# Patient Record
Sex: Female | Born: 1970 | Race: Black or African American | Hispanic: No | Marital: Married | Smoking: Never smoker
Health system: Southern US, Community
[De-identification: ages and names within clinical notes are randomized; demographics above are authoritative.]

## PROBLEM LIST (undated history)

## (undated) DIAGNOSIS — D259 Leiomyoma of uterus, unspecified: Secondary | ICD-10-CM

## (undated) DIAGNOSIS — D649 Anemia, unspecified: Secondary | ICD-10-CM

## (undated) DIAGNOSIS — N946 Dysmenorrhea, unspecified: Secondary | ICD-10-CM

## (undated) HISTORY — PX: DILATION AND CURETTAGE OF UTERUS: SHX78

---

## 1998-03-08 ENCOUNTER — Inpatient Hospital Stay (HOSPITAL_COMMUNITY): Admission: AD | Admit: 1998-03-08 | Discharge: 1998-03-08 | Payer: Self-pay | Admitting: *Deleted

## 1998-04-07 ENCOUNTER — Inpatient Hospital Stay (HOSPITAL_COMMUNITY): Admission: AD | Admit: 1998-04-07 | Discharge: 1998-04-09 | Payer: Self-pay | Admitting: Obstetrics and Gynecology

## 1999-06-22 ENCOUNTER — Other Ambulatory Visit: Admission: RE | Admit: 1999-06-22 | Discharge: 1999-06-22 | Payer: Self-pay | Admitting: *Deleted

## 2000-07-02 ENCOUNTER — Other Ambulatory Visit: Admission: RE | Admit: 2000-07-02 | Discharge: 2000-07-02 | Payer: Self-pay | Admitting: *Deleted

## 2001-07-04 ENCOUNTER — Other Ambulatory Visit: Admission: RE | Admit: 2001-07-04 | Discharge: 2001-07-04 | Payer: Self-pay | Admitting: *Deleted

## 2002-09-03 ENCOUNTER — Other Ambulatory Visit: Admission: RE | Admit: 2002-09-03 | Discharge: 2002-09-03 | Payer: Self-pay | Admitting: Obstetrics & Gynecology

## 2004-03-07 ENCOUNTER — Ambulatory Visit: Payer: Self-pay | Admitting: Family Medicine

## 2004-03-10 ENCOUNTER — Ambulatory Visit: Payer: Self-pay | Admitting: Family Medicine

## 2004-05-15 ENCOUNTER — Inpatient Hospital Stay (HOSPITAL_COMMUNITY): Admission: AD | Admit: 2004-05-15 | Discharge: 2004-05-15 | Payer: Self-pay | Admitting: Obstetrics & Gynecology

## 2004-05-18 ENCOUNTER — Ambulatory Visit (HOSPITAL_COMMUNITY): Admission: AD | Admit: 2004-05-18 | Discharge: 2004-05-19 | Payer: Self-pay | Admitting: Obstetrics & Gynecology

## 2006-01-16 ENCOUNTER — Inpatient Hospital Stay (HOSPITAL_COMMUNITY): Admission: AD | Admit: 2006-01-16 | Discharge: 2006-01-18 | Payer: Self-pay | Admitting: Obstetrics & Gynecology

## 2006-08-13 IMAGING — US US OB COMP LESS 14 WK
1 series · 18 of 28 positions shown · non-contrast
Comparison: none

CLINICAL DATA: History of vaginal bleeding and spotting.
 OB ULTRASOUND <14 WEEKS WITH TRANSVAGINAL MODIFY:
 The uterus is normal in size.  There is an intrauterine gestational sac present.  No yolk sac is seen.  An embryo is visible but no fetal cardiac activity is seen, consistent with fetal demise.  By crown-rump length the estimated gestational age is 7 weeks 1 day.  A small anterior uterine fibroid on the left is noted measuring 8 x 11 x 11 mm.  Both ovaries appear normal with probable resolving corpus luteum cyst on the left.  No free fluid is seen.

[Series 1: us ob comp<14 wk · 18 of 47 slices shown]
[im 1/47]
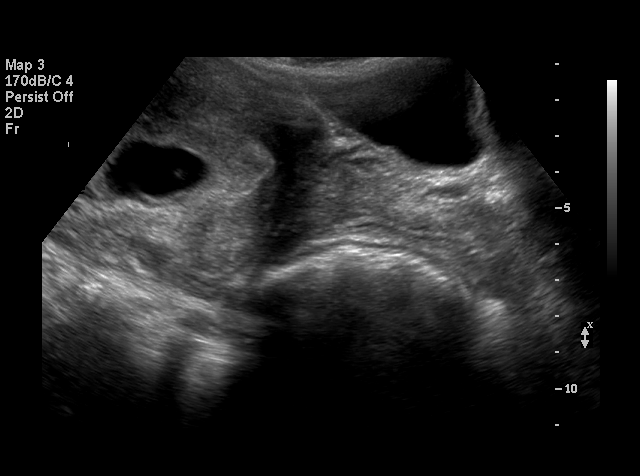
[im 4/47]
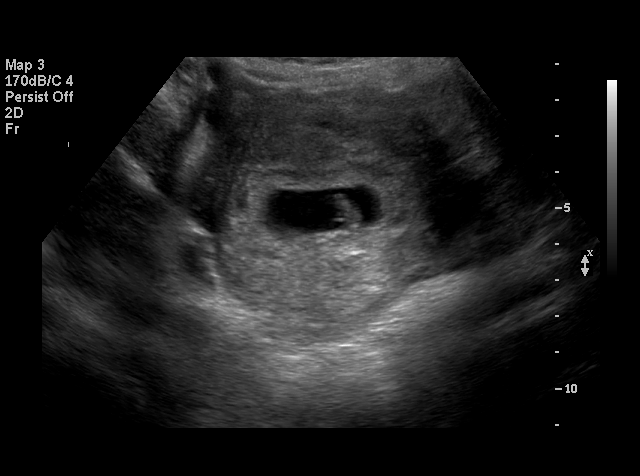
[im 6/47]
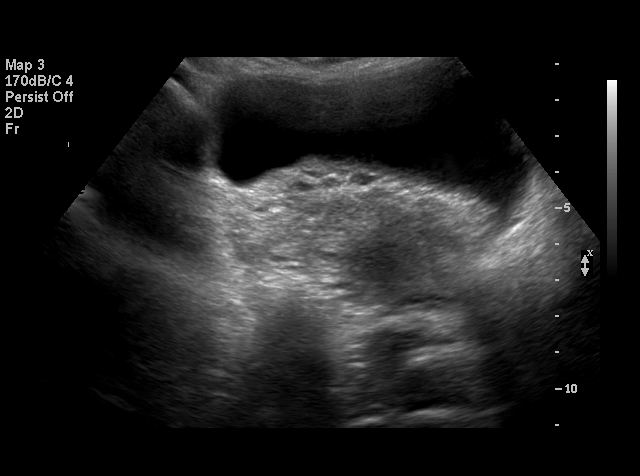
[im 9/47]
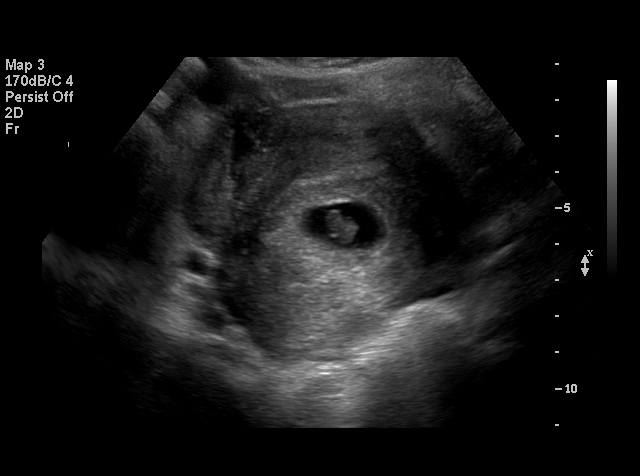
[im 12/47]
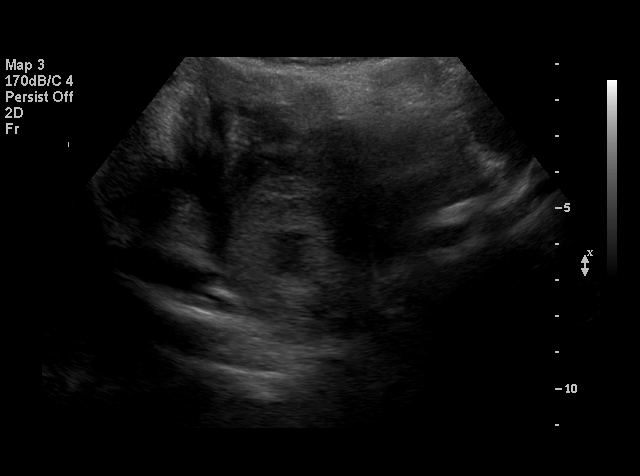
[im 14/47]
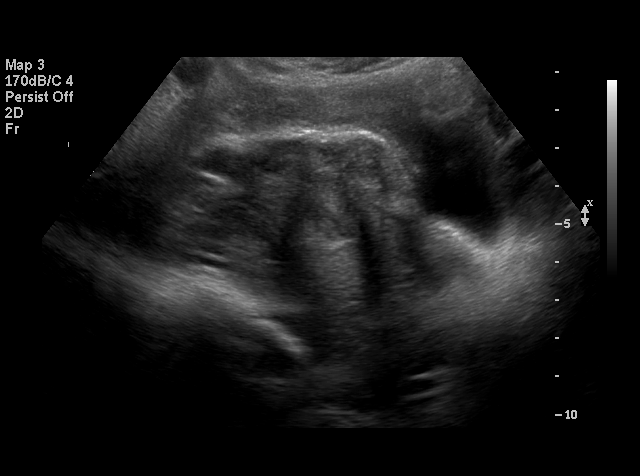
[im 18/47]
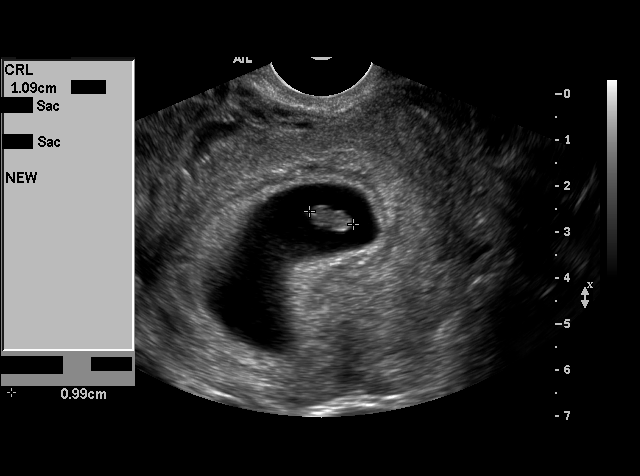
[im 19/47]
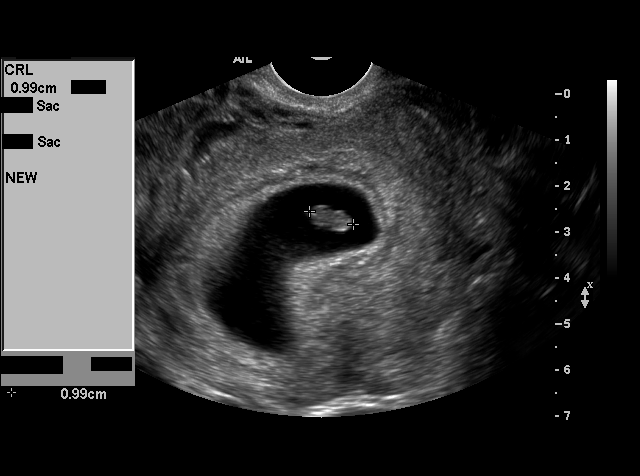
[im 23/47]
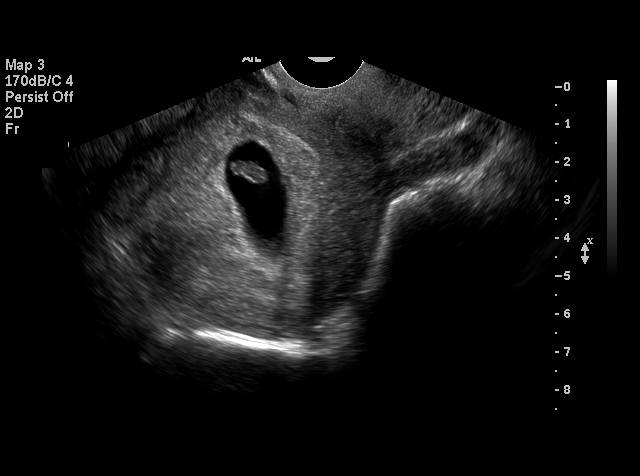
[im 24/47]
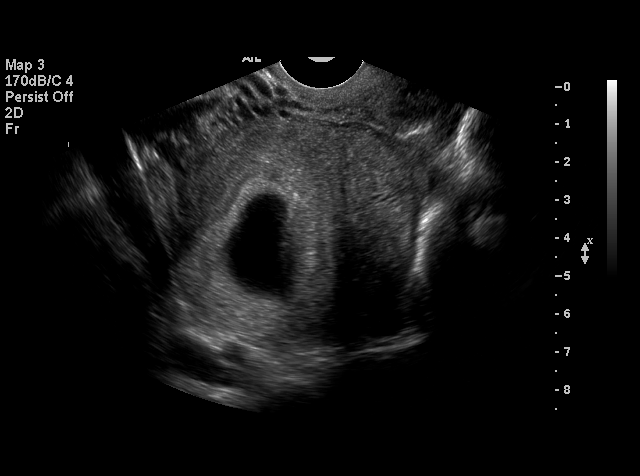
[im 28/47]
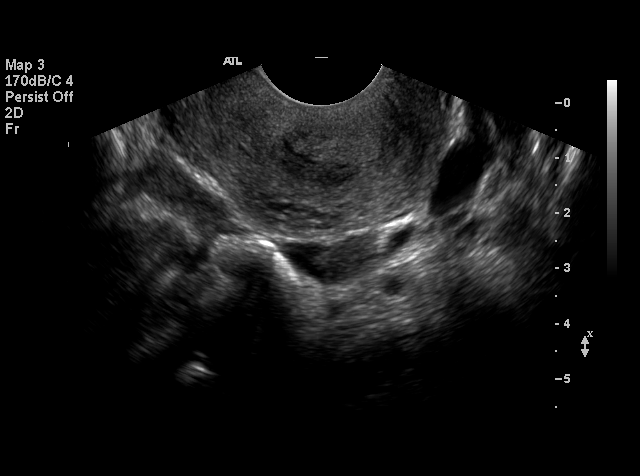
[im 29/47]
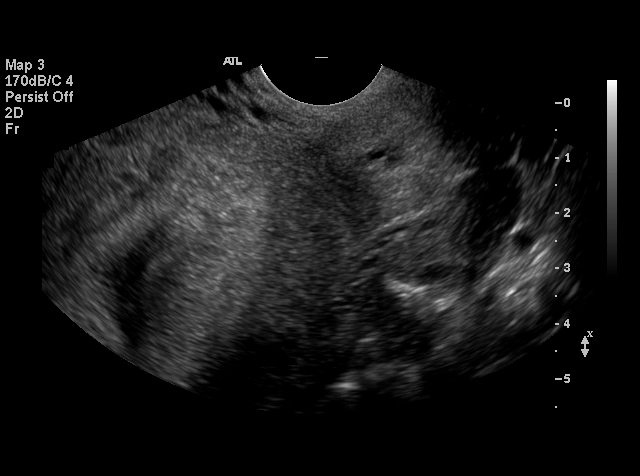
[im 33/47]
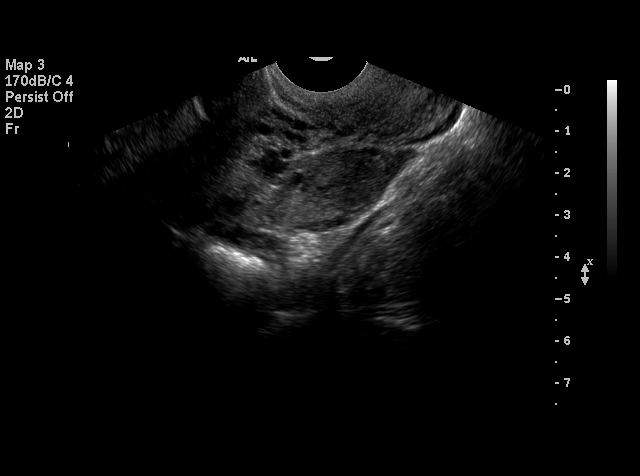
[im 36/47]
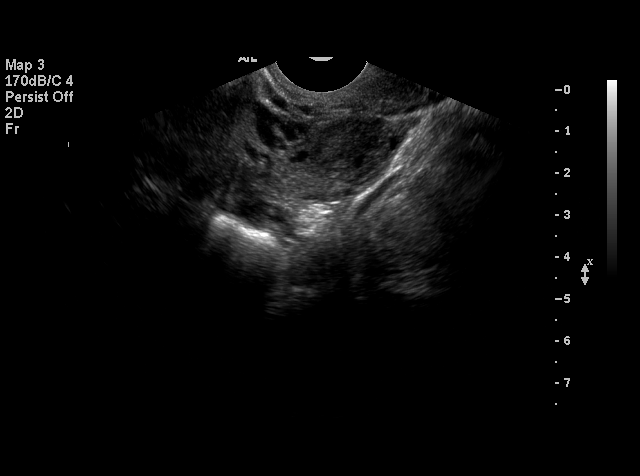
[im 38/47]
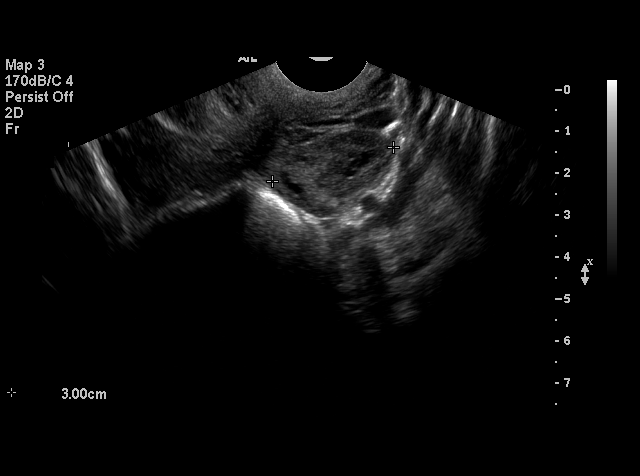
[im 41/47]
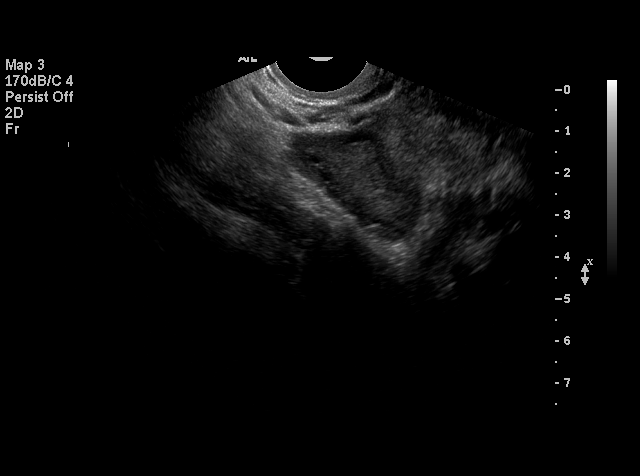
[im 43/47]
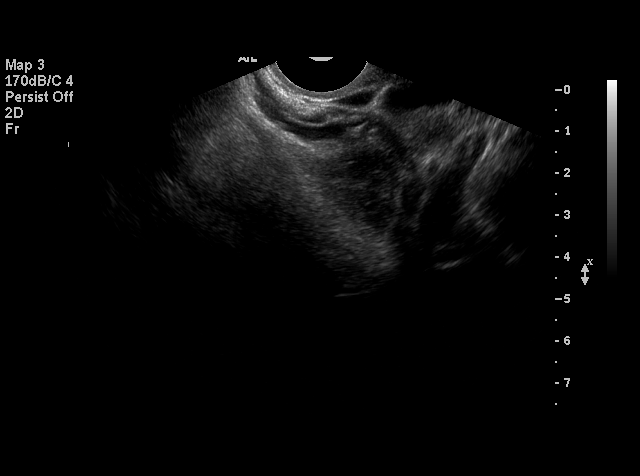
[im 47/47]
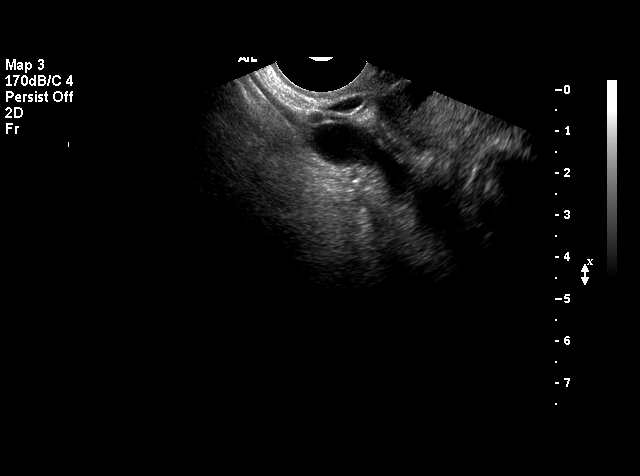

[18 of 28 positions shown; findings below may reference images not displayed]

IMPRESSION: Intrauterine fetal pole with no cardiac activity, consistent with fetal demise.

## 2006-08-16 IMAGING — US US OB TRANSVAGINAL
1 series · 14 of 23 positions shown · non-contrast
Comparison: none

CLINICAL DATA: Vaginal bleeding.  History of fetal demise 05/15/04.
 PELVIC ULTRASOUND ? 05/19/04, 9016 HOURS:

[Series 1: us ob transvaginal · 0.19mm/px · 14 of 23 slices shown]
[im 1/23]
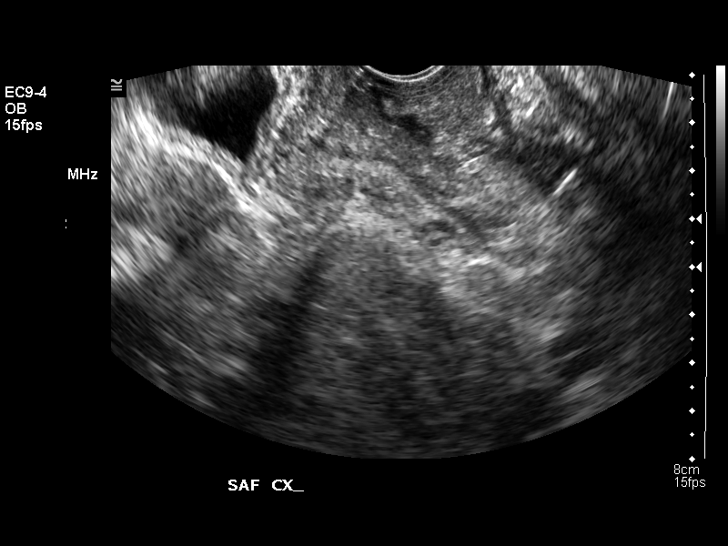
[im 3/23]
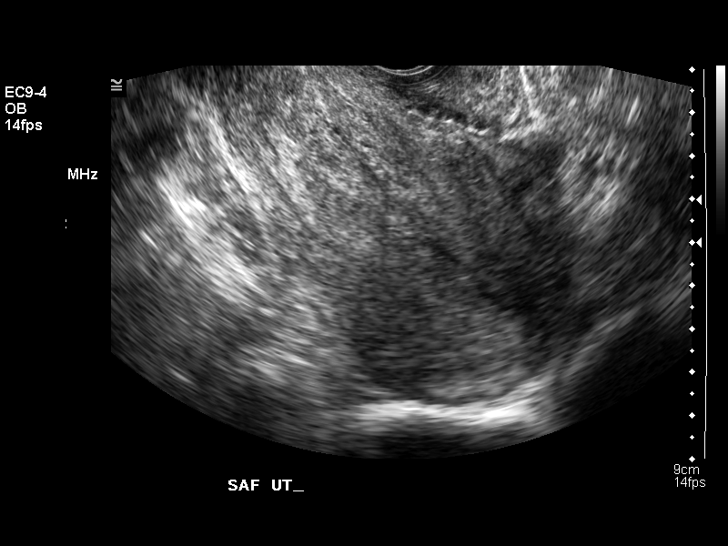
[im 5/23]
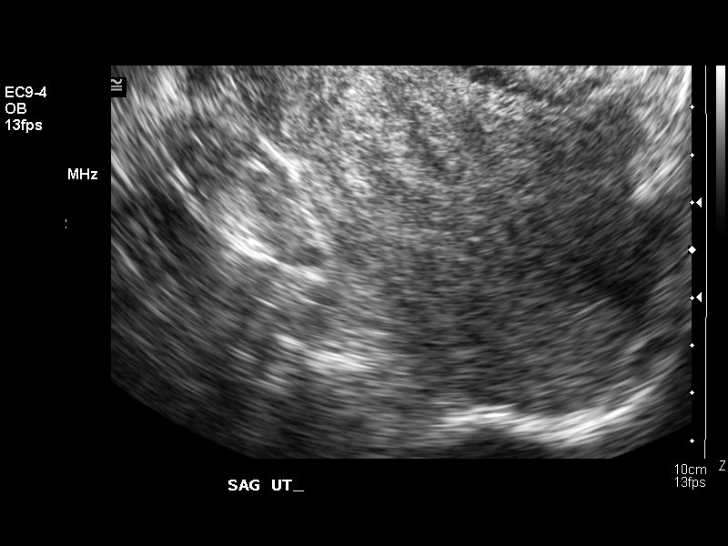
[im 6/23]
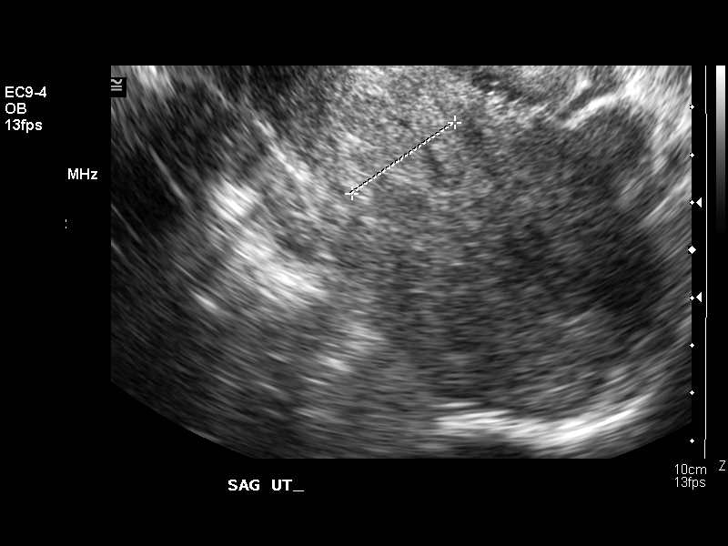
[im 8/23]
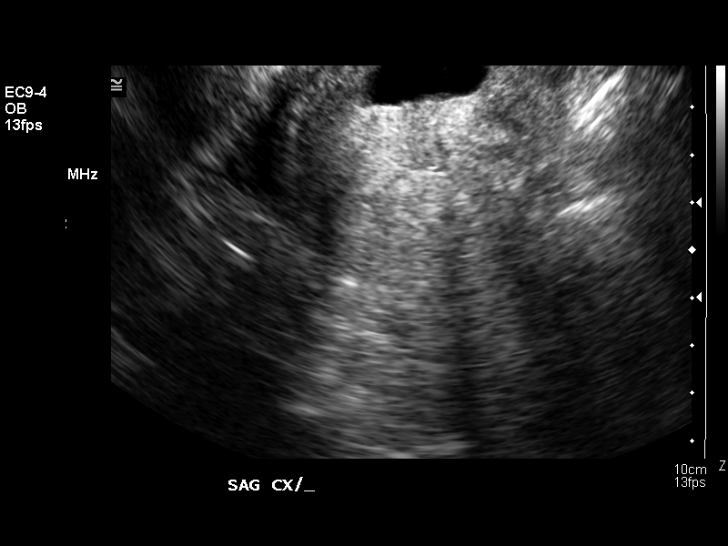
[im 10/23]
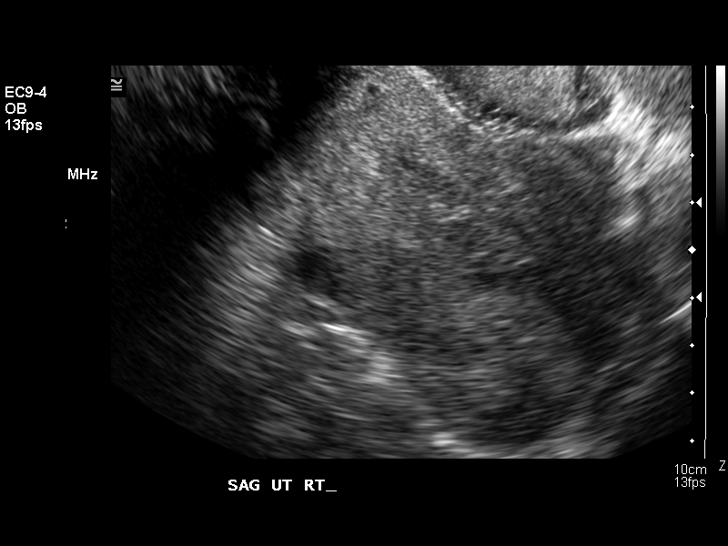
[im 11/23]
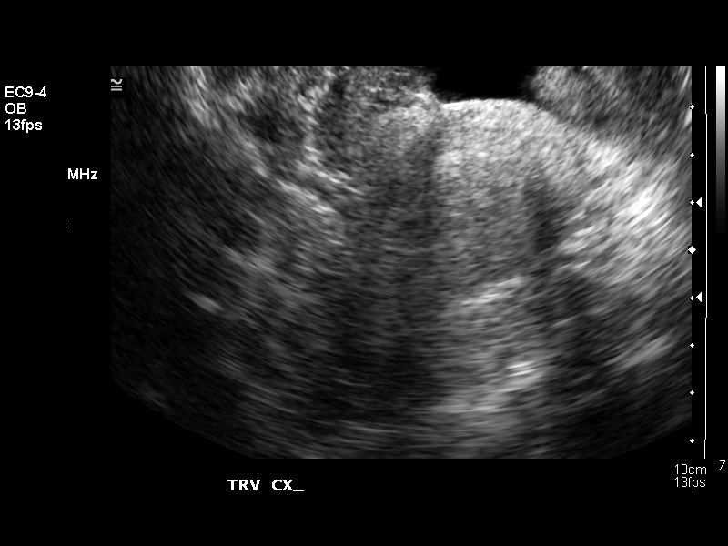
[im 13/23]
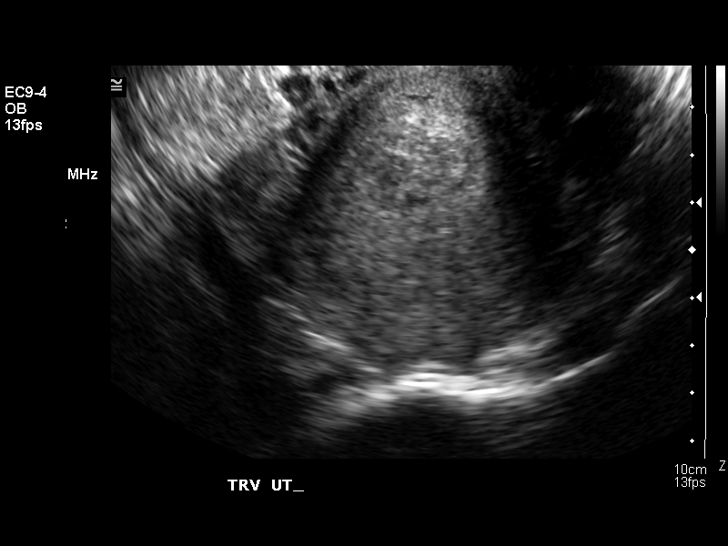
[im 14/23]
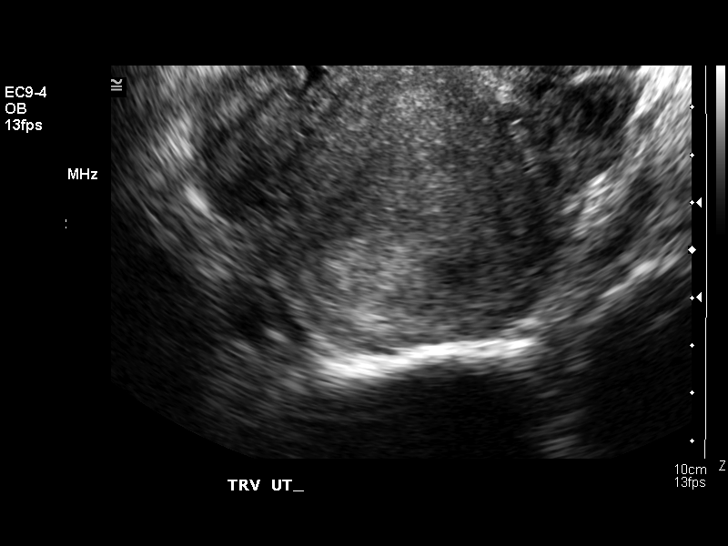
[im 16/23]
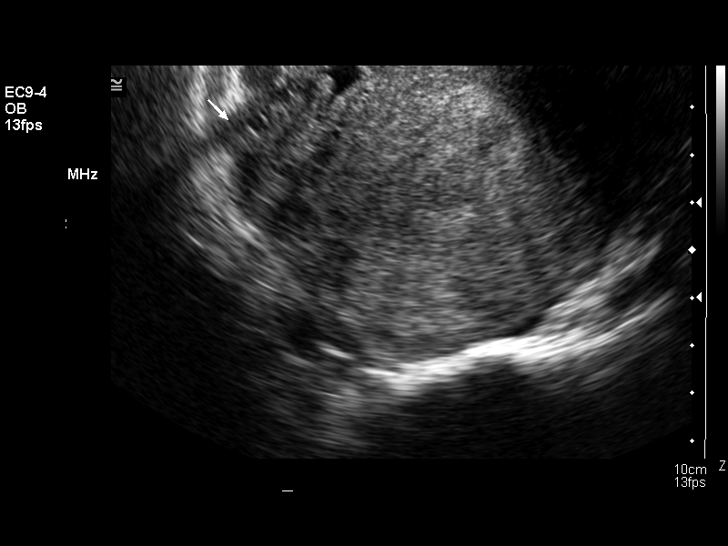
[im 18/23]
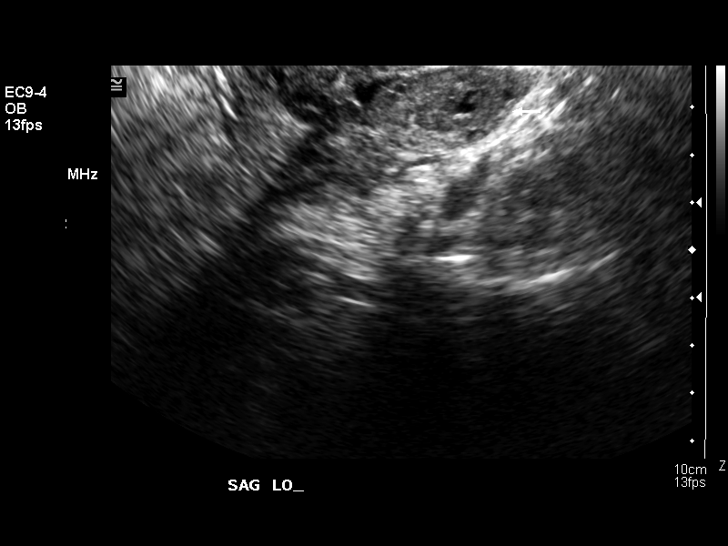
[im 19/23]
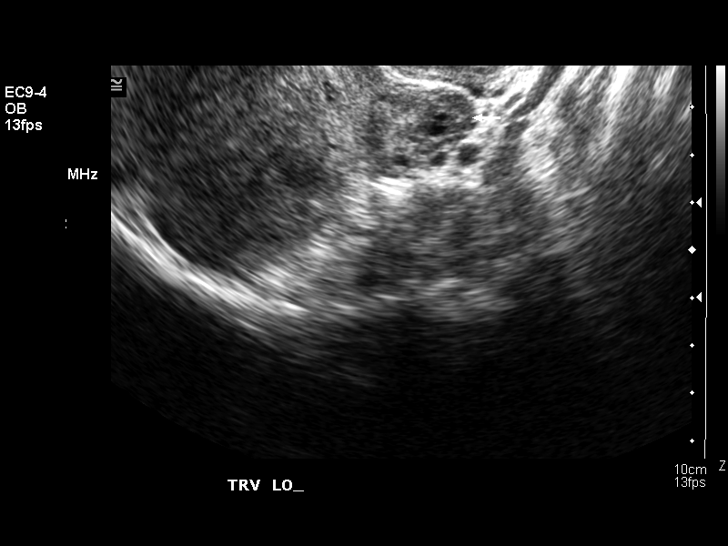
[im 21/23]
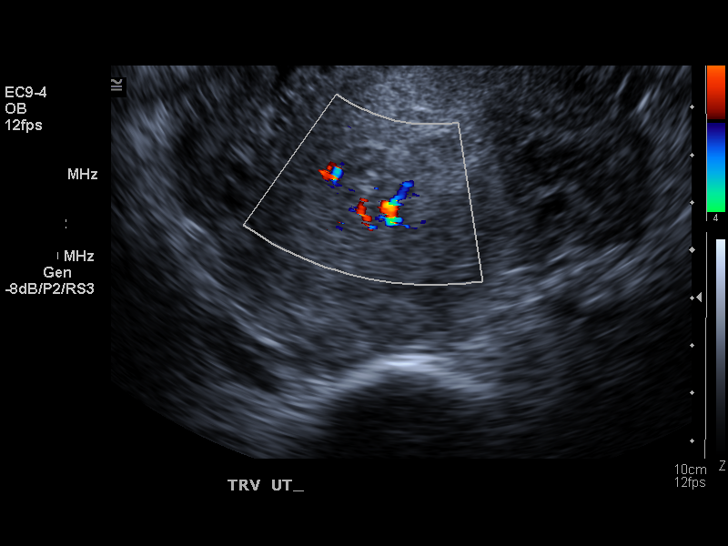
[im 23/23]
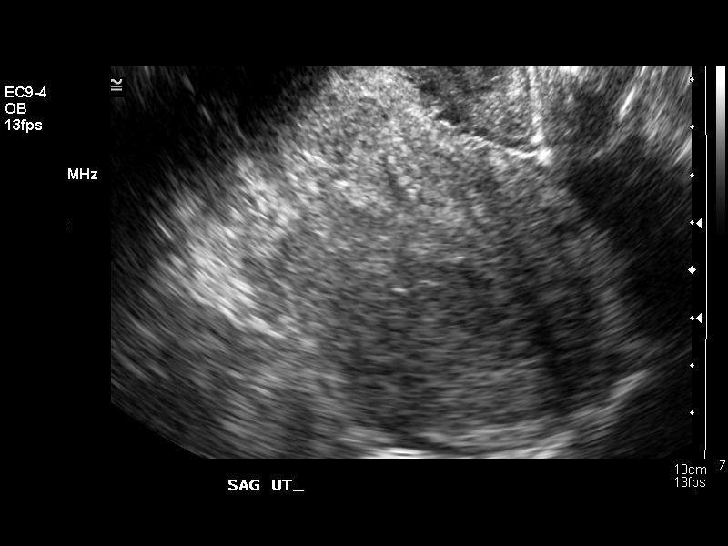

[14 of 23 positions shown; findings below may reference images not displayed]

FINDINGS: There is no gestational sac.  The endometrial stripe region is heterogeneous and thickened, measuring up to 2.6 cm.  A tiny amount of free fluid is present.  The ovaries are within normal limits.
IMPRESSION: Heterogeneous and thickened endometrial stripe worrisome for retained products of conception.

## 2013-05-13 ENCOUNTER — Emergency Department: Payer: Self-pay | Admitting: Emergency Medicine

## 2015-08-18 ENCOUNTER — Inpatient Hospital Stay
Admission: EM | Admit: 2015-08-18 | Discharge: 2015-08-19 | DRG: 812 | Disposition: A | Discharge status: Home / Self Care | Payer: BLUE CROSS/BLUE SHIELD | Attending: Internal Medicine | Admitting: Internal Medicine

## 2015-08-18 DIAGNOSIS — D509 Iron deficiency anemia, unspecified: Secondary | ICD-10-CM

## 2015-08-18 DIAGNOSIS — E876 Hypokalemia: Secondary | ICD-10-CM | POA: Diagnosis present

## 2015-08-18 DIAGNOSIS — R55 Syncope and collapse: Secondary | ICD-10-CM

## 2015-08-18 DIAGNOSIS — N920 Excessive and frequent menstruation with regular cycle: Secondary | ICD-10-CM

## 2015-08-18 DIAGNOSIS — D696 Thrombocytopenia, unspecified: Secondary | ICD-10-CM | POA: Diagnosis present

## 2015-08-18 DIAGNOSIS — D259 Leiomyoma of uterus, unspecified: Secondary | ICD-10-CM | POA: Diagnosis present

## 2015-08-18 DIAGNOSIS — N946 Dysmenorrhea, unspecified: Secondary | ICD-10-CM | POA: Diagnosis present

## 2015-08-18 DIAGNOSIS — D649 Anemia, unspecified: Secondary | ICD-10-CM | POA: Diagnosis present

## 2015-08-18 HISTORY — DX: Dysmenorrhea, unspecified: N94.6

## 2015-08-18 LAB — BASIC METABOLIC PANEL
Anion gap: 5 (ref 5–15)
BUN: 12 mg/dL (ref 6–20)
CO2: 22 mmol/L (ref 22–32)
Calcium: 8.6 mg/dL — ABNORMAL LOW (ref 8.9–10.3)
Chloride: 105 mmol/L (ref 101–111)
Creatinine, Ser: 0.62 mg/dL (ref 0.44–1.00)
GFR calc Af Amer: >60 mL/min (ref >60)
GFR calc non Af Amer: >60 mL/min (ref >60)
Glucose, Bld: 102 mg/dL — ABNORMAL HIGH (ref 65–99)
POTASSIUM: 3.2 mmol/L — AB (ref 3.5–5.1)
Sodium: 132 mmol/L — ABNORMAL LOW (ref 135–145)

## 2015-08-18 LAB — CBC WITH DIFFERENTIAL/PLATELET
BAND NEUTROPHILS: 0 %
Basophils Absolute: 0 10*3/uL (ref 0–0.1)
Basophils Relative: 0 %
Blasts: 0 %
EOS ABS: 0 10*3/uL (ref 0–0.7)
EOS PCT: 0 %
HCT: 13.4 % — CL (ref 35.0–47.0)
HEMOGLOBIN: 3.4 g/dL — AB (ref 12.0–16.0)
Lymphocytes Relative: 15 %
Lymphs Abs: 1.3 10*3/uL (ref 1.0–3.6)
MCH: 14.7 pg — ABNORMAL LOW (ref 26.0–34.0)
MCHC: 25.4 g/dL — AB (ref 32.0–36.0)
MCV: 58 fL — ABNORMAL LOW (ref 80.0–100.0)
MONO ABS: 0.2 10*3/uL (ref 0.2–0.9)
Metamyelocytes Relative: 0 %
Monocytes Relative: 2 %
Myelocytes: 0 %
Neutro Abs: 7.3 10*3/uL — ABNORMAL HIGH (ref 1.4–6.5)
Neutrophils Relative %: 83 %
OTHER: 0 %
Platelets: 65 10*3/uL — ABNORMAL LOW (ref 150–440)
Promyelocytes Absolute: 0 %
RBC: 2.32 MIL/uL — ABNORMAL LOW (ref 3.80–5.20)
RDW: 34.3 % — ABNORMAL HIGH (ref 11.5–14.5)
WBC: 8.8 10*3/uL (ref 3.6–11.0)
nRBC: 0 /100 WBC

## 2015-08-18 LAB — URINALYSIS COMPLETE WITH MICROSCOPIC (ARMC ONLY)
BILIRUBIN URINE: NEGATIVE
Bacteria, UA: NONE SEEN
Glucose, UA: NEGATIVE mg/dL
KETONES UR: NEGATIVE mg/dL
LEUKOCYTES UA: NEGATIVE
NITRITE: NEGATIVE
PH: 6 (ref 5.0–8.0)
Protein, ur: NEGATIVE mg/dL
RBC / HPF: NONE SEEN RBC/hpf (ref 0–5)
Specific Gravity, Urine: 1.003 — ABNORMAL LOW (ref 1.005–1.030)

## 2015-08-18 LAB — IRON AND TIBC
IRON: 11 ug/dL — AB (ref 28–170)
Saturation Ratios: 3 % — ABNORMAL LOW (ref 10.4–31.8)
TIBC: 424 ug/dL (ref 250–450)
UIBC: 413 ug/dL

## 2015-08-18 LAB — RETICULOCYTES
RBC.: 2.35 MIL/uL — ABNORMAL LOW (ref 3.80–5.20)
Retic Count, Absolute: 72.9 10*3/uL (ref 19.0–183.0)
Retic Ct Pct: 3.1 % (ref 0.4–3.1)

## 2015-08-18 LAB — FOLATE: Folate: 31 ng/mL (ref >5.9)

## 2015-08-18 LAB — PREPARE RBC (CROSSMATCH)

## 2015-08-18 LAB — PREGNANCY, URINE: Preg Test, Ur: NEGATIVE

## 2015-08-18 LAB — TROPONIN I

## 2015-08-18 LAB — ABO/RH: ABO/RH(D): B POS

## 2015-08-18 LAB — FERRITIN: FERRITIN: 5 ng/mL — AB (ref 11–307)

## 2015-08-18 LAB — GLUCOSE, CAPILLARY: Glucose-Capillary: 92 mg/dL (ref 65–99)

## 2015-08-18 MED ORDER — ADULT MULTIVITAMIN W/MINERALS CH
1.0000 | ORAL_TABLET | Freq: Every day | ORAL | Status: DC
Start: 2015-08-18 — End: 2015-08-19
  Administered 2015-08-19 (×2): 1 via ORAL
  Filled 2015-08-18 (×3): qty 1

## 2015-08-18 MED ORDER — ACETAMINOPHEN 325 MG PO TABS: 650.0000 mg | ORAL_TABLET | Freq: Four times a day (QID) | ORAL | Status: DC | PRN

## 2015-08-18 MED ORDER — SODIUM CHLORIDE 0.9 % IV SOLN
INTRAVENOUS | Status: DC
  Administered 2015-08-18: 21:00:00 via INTRAVENOUS

## 2015-08-18 MED ORDER — ONDANSETRON HCL 4 MG/2ML IJ SOLN: 4.0000 mg | Freq: Four times a day (QID) | INTRAMUSCULAR | Status: DC | PRN

## 2015-08-18 MED ORDER — ACETAMINOPHEN 650 MG RE SUPP
650.0000 mg | Freq: Four times a day (QID) | RECTAL | Status: DC | PRN
  Filled 2015-08-18: qty 1

## 2015-08-18 MED ORDER — FERROUS SULFATE 325 (65 FE) MG PO TABS
325.0000 mg | ORAL_TABLET | Freq: Two times a day (BID) | ORAL | Status: DC
  Administered 2015-08-18 – 2015-08-19 (×2): 325 mg via ORAL
  Filled 2015-08-18 (×2): qty 1

## 2015-08-18 MED ORDER — PENTAFLUOROPROP-TETRAFLUOROETH EX AERO
INHALATION_SPRAY | CUTANEOUS | Status: AC
  Filled 2015-08-18: qty 30

## 2015-08-18 MED ORDER — ONDANSETRON HCL 4 MG PO TABS: 4.0000 mg | ORAL_TABLET | Freq: Four times a day (QID) | ORAL | Status: DC | PRN

## 2015-08-18 MED ORDER — SODIUM CHLORIDE 0.9 % IV SOLN
10.0000 mL/h | Freq: Once | INTRAVENOUS | Status: AC
  Administered 2015-08-18: 10 mL/h via INTRAVENOUS

## 2015-08-18 MED ORDER — SODIUM CHLORIDE 0.9 % IV BOLUS (SEPSIS)
500.0000 mL | Freq: Once | INTRAVENOUS | Status: AC
  Administered 2015-08-18: 500 mL via INTRAVENOUS

## 2015-08-18 MED ORDER — POTASSIUM CHLORIDE CRYS ER 20 MEQ PO TBCR
40.0000 meq | EXTENDED_RELEASE_TABLET | Freq: Once | ORAL | Status: AC
  Administered 2015-08-18: 40 meq via ORAL
  Filled 2015-08-18: qty 2

## 2015-08-18 NOTE — ED Notes (Signed)
Pt arrives to ER via POV c/o syncope this AM. Pt states that she felt dizzy and then passed out. Pt states that she had a heavy period that she is "coming off of", unsure if related. PT denies pain. Pt alert and oriented X4, active, cooperative, pt in NAD. RR even and unlabored, color WNL.

## 2015-08-18 NOTE — H&P (Signed)
[facility] [place] NAME: Krista Fernandez    MR#:  [id number]  DATE OF BIRTH:  1971-05-21  DATE OF ADMISSION:  08/18/2015  PRIMARY CARE PHYSICIAN: Ashok Norris, MD   REQUESTING/REFERRING PHYSICIAN: Dr. Carrie Mew  CHIEF COMPLAINT:   Chief Complaint  Patient presents with  . Loss of Consciousness    HISTORY OF PRESENT ILLNESS:  Krista Fernandez  is a 45 y.o. female with No significant past medical history but has been having dysmenorrhea for the last 6 months presents to the hospital secondary to a syncopal episode. Patient states her menses have become more heavy urine the last 6-7 months. She still gets them regularly every month, now they are more prolonged over 7-8 days out of which 4 days she has heavier bleeding. Also has some clots and pads need to be changed every 2 hours. She was noted to have a fibroid several years ago. Also had a D&C done for possible ectopic pregnancy in the past. In the last couple of months she has noted that she becomes dizzy and weak and tired during her menstrual periods and thought that was expected due to blood loss. Today she is almost at the end of her period, day 8, and she was walking to the kitchen and felt dizzy lightheaded and then passed out for 2 minutes. She was brought to the emergency room, noted to have a hemoglobin of 3.4 and platelets of 65. She is being admitted for syncope secondary to her anemia requiring blood transfusion.   PAST MEDICAL HISTORY:   Past Medical History  Diagnosis Date  . Dysmenorrhea     PAST SURGICAL HISTORY:   Past Surgical History  Procedure Laterality Date  . Dilation and curettage of uterus      SOCIAL HISTORY:   Social History  Substance Use Topics  . Smoking status: Never Smoker   . Smokeless tobacco: Not on file  . Alcohol Use: No    FAMILY HISTORY:   Family History  Problem Relation Age of Onset  . Liver cancer Mother   . CVA  Father     DRUG ALLERGIES:  No Known Allergies  REVIEW OF SYSTEMS:   Review of Systems  Constitutional: Positive for malaise/fatigue. Negative for fever, chills and weight loss.  HENT: Negative for ear discharge, ear pain, nosebleeds and tinnitus.   Eyes: Negative for blurred vision, double vision and photophobia.  Respiratory: Negative for cough, hemoptysis, shortness of breath and wheezing.   Cardiovascular: Negative for chest pain, palpitations, orthopnea and leg swelling.  Gastrointestinal: Negative for heartburn, nausea, vomiting, abdominal pain, diarrhea, constipation and melena.  Genitourinary: Negative for dysuria, urgency, frequency and hematuria.       Dysmenorrhea  Musculoskeletal: Negative for myalgias, back pain and neck pain.  Skin: Negative for rash.  Neurological: Positive for dizziness. Negative for tingling, tremors, sensory change, speech change, focal weakness and headaches.  Endo/Heme/Allergies: Does not bruise/bleed easily.  Psychiatric/Behavioral: Negative for depression.    MEDICATIONS AT HOME:   Prior to Admission medications   Medication Sig Start Date End Date Taking? Authorizing Provider  Multiple Vitamin (MULTIVITAMIN WITH MINERALS) TABS tablet Take 1 tablet by mouth daily.   Yes Historical Provider, MD      VITAL SIGNS:  Blood pressure 113/59, pulse 77, temperature 98 F (36.7 C), temperature source Oral, resp. rate 16, height 5\' 6"  (1.676 m), weight 61.689 kg (136 lb), last menstrual period 08/18/2015, SpO2 100 %.  PHYSICAL  EXAMINATION:   Physical Exam  GENERAL:  45 y.o.-year-old patient lying in the bed with no acute distress.  EYES: Pupils equal, round, reactive to light and accommodation. No scleral icterus. Extraocular muscles intact.Pale conjunctivae HEENT: Head atraumatic, normocephalic. Oropharynx and nasopharynx clear.  NECK:  Supple, no jugular venous distention. No thyroid enlargement, no tenderness.  LUNGS: Normal breath sounds  bilaterally, no wheezing, rales,rhonchi or crepitation. No use of accessory muscles of respiration.  CARDIOVASCULAR: S1, S2 normal. No  rubs, or gallops. 3/6 systolic murmur is present ABDOMEN: Soft, nontender, nondistended. Bowel sounds present. No organomegaly or mass.  EXTREMITIES: No pedal edema, cyanosis, or clubbing.  NEUROLOGIC: Cranial nerves II through XII are intact. Muscle strength 5/5 in all extremities. Sensation intact. Gait not checked.  PSYCHIATRIC: The patient is alert and oriented x 3.  SKIN: No obvious rash, lesion, or ulcer.   LABORATORY PANEL:   CBC  Recent Labs Lab 08/18/15 1325  WBC 8.8  HGB 3.4*  HCT 13.4*  PLT 65*   ------------------------------------------------------------------------------------------------------------------  Chemistries   Recent Labs Lab 08/18/15 1145  NA 132*  K 3.2*  CL 105  CO2 22  GLUCOSE 102*  BUN 12  CREATININE 0.62  CALCIUM 8.6*   ------------------------------------------------------------------------------------------------------------------  Cardiac Enzymes  Recent Labs Lab 08/18/15 1145  TROPONINI <0.03   ------------------------------------------------------------------------------------------------------------------  RADIOLOGY:  No results found.  EKG:   Orders placed or performed during the hospital encounter of 08/18/15  . ED EKG  . ED EKG    IMPRESSION AND PLAN:   Krista Fernandez  is a 45 y.o. female with No significant past medical history but has been having dysmenorrhea for the last 6 months presents to the hospital secondary to a syncopal episode.  #1 anemia-acute on chronic due to heavy menstrual loss. -No baseline hemoglobin available. MCV is very low at 58. Likely iron deficiency anemia. -2 units transfusion ordered. Also started on iron supplements. -Anemia labs panel ordered. GYN follow-up. -Vitals are stable.  #2 dysmenorrhea-could be fibroids. Follows with OB/GYN in  [place]. -GYN consult in the hospital. Likely transvaginal ultrasound needed for fibroids. Workup can be done as an outpatient.  #3 thrombocytopenia-no prior platelet count to compare with. Send platelet antibodies. -Continue to monitor at this time. No indication for platelet transfusion  #4 hypokalemia-being replaced  #5 DVT prophylaxis-Ted's and SCDs. Also patient is ambulatory    All the records are reviewed and case discussed with ED provider. Management plans discussed with the patient, family and they are in agreement.  CODE STATUS: Full Code  TOTAL TIME TAKING CARE OF THIS PATIENT: 50 minutes.    Gladstone Lighter M.D on 08/18/2015 at 2:45 PM  Between 7am to 6pm - Pager - [phone]  After 6pm go to [email or web address] - password [email or web address] [facility]  Office  [phone]  CC: Primary care physician; Ashok Norris, MD

## 2015-08-18 NOTE — ED Notes (Signed)
Report called to [facility] on MB  Pt to transfer at this time

## 2015-08-18 NOTE — ED Provider Notes (Signed)
[facility] Emergency Department Provider Note  ____________________________________________  Time seen: 12:20 PM  I have reviewed the triage vital signs and the nursing notes.   HISTORY  Chief Complaint Loss of Consciousness    HPI Krista Fernandez is a 45 y.o. female who complains of syncope this morning around 10:15 AM. She's been getting dizzy and feeling weak whenever she stands up and walks her last few days. Denies any change in her bowel movements such as bloody stools or black stools. No vomiting. She's been eating okay but not drinking a lot of fluids over the past week. Additionally, she reports that she's been having very heavy periods for the last 3 months that lasts for 8 or 9 days. This is much worse than previous but still regular without metrorrhagia. She seen her GYN for this who did not start any intervention at this time.  Denies chest pain or shortness of breath. No numbness tingling weakness or headaches. She just completed her most recent period with only some very light spotting today.    History reviewed. No pertinent past medical history.   There are no active problems to display for this patient.    History reviewed. No pertinent past surgical history.   No current outpatient prescriptions on file. none  Allergies Review of patient's allergies indicates no known allergies.   No family history on file.  Social History Social History  Substance Use Topics  . Smoking status: Never Smoker   . Smokeless tobacco: None  . Alcohol Use: No    Review of Systems  Constitutional:   No fever or chills. No weight changes. No recent viral illness Eyes:   No blurry vision or double vision.  ENT:   No sore throat.  Cardiovascular:   No chest pain. Respiratory:   No dyspnea or cough. Gastrointestinal:   Negative for abdominal pain, vomiting and diarrhea.  No BRBPR or melena. Genitourinary:   Negative for dysuria or difficulty  urinating. Positive menorrhagia. Musculoskeletal:   Negative for back pain. No joint swelling or pain. Skin:   Negative for rash. Neurological:   Negative for headaches, focal weakness or numbness. Psychiatric:  No anxiety or depression.   Endocrine:  No changes in energy or sleep difficulty.  10-point ROS otherwise negative.  ____________________________________________   PHYSICAL EXAM:  VITAL SIGNS: ED Triage Vitals  Enc Vitals Group     BP 08/18/15 1143 106/53 mmHg     Pulse Rate 08/18/15 1143 89     Resp 08/18/15 1143 16     Temp 08/18/15 1143 98 F (36.7 C)     Temp Source 08/18/15 1143 Oral     SpO2 08/18/15 1143 100 %     Weight 08/18/15 1143 136 lb (61.689 kg)     Height 08/18/15 1143 5\' 6"  (1.676 m)     Head Cir --      Peak Flow --      Pain Score --      Pain Loc --      Pain Edu? --      Excl. in [place]? --     Vital signs reviewed, nursing assessments reviewed.   Constitutional:   Alert and oriented. Well appearing and in no distress. Eyes:   No scleral icterus. Significant conjunctival pallor. PERRL. EOMI ENT   Head:   Normocephalic and atraumatic.   Nose:   No congestion/rhinnorhea. No septal hematoma   Mouth/Throat:   MMM, no pharyngeal erythema. No peritonsillar mass.  Oral mucosa is pale   Neck:   No stridor. No SubQ emphysema. No meningismus. Hematological/Lymphatic/Immunilogical:   No cervical lymphadenopathy. Cardiovascular:   RRR. Symmetric bilateral radial and DP pulses.  No murmurs.  Respiratory:   Normal respiratory effort without tachypnea nor retractions. Breath sounds are clear and equal bilaterally. No wheezes/rales/rhonchi. Gastrointestinal:   Soft and nontender. Non distended. There is no CVA tenderness.  No rebound, rigidity, or guarding. Genitourinary:   deferred Musculoskeletal:   Nontender with normal range of motion in all extremities. No joint effusions.  No lower extremity tenderness.  No edema. Neurologic:   Normal  speech and language.  CN 2-10 normal. Motor grossly intact. No gross focal neurologic deficits are appreciated.  Skin:    Skin is warm, dry and intact. No rash noted.  No petechiae, purpura, or bullae. Psychiatric:   Mood and affect are normal. ____________________________________________    LABS (pertinent positives/negatives) (all labs ordered are listed, but only abnormal results are displayed) Labs Reviewed  BASIC METABOLIC PANEL - Abnormal; Notable for the following:    Sodium 132 (*)    Potassium 3.2 (*)    Glucose, Bld 102 (*)    Calcium 8.6 (*)    All other components within normal limits  URINALYSIS COMPLETEWITH MICROSCOPIC ([facility] ONLY) - Abnormal; Notable for the following:    Color, Urine STRAW (*)    APPearance CLEAR (*)    Specific Gravity, Urine 1.003 (*)    Hgb urine dipstick 2+ (*)    Squamous Epithelial / LPF 0-5 (*)    All other components within normal limits  CBC WITH DIFFERENTIAL/PLATELET - Abnormal; Notable for the following:    RBC 2.32 (*)    Hemoglobin 3.4 (*)    HCT 13.4 (*)    MCV 58.0 (*)    MCH 14.7 (*)    MCHC 25.4 (*)    RDW 34.3 (*)    Platelets 65 (*)    All other components within normal limits  TROPONIN I  GLUCOSE, CAPILLARY  PREGNANCY, URINE  CBG MONITORING, ED  POC URINE PREG, ED  TYPE AND SCREEN  PREPARE RBC (CROSSMATCH)   ____________________________________________   EKG  Interpreted by me Normal sinus rhythm rate of 95, normal axis and intervals. Normal QRS and ST segments. Isolated T-wave inversion in V2 which is nonspecific.  ____________________________________________    RADIOLOGY    ____________________________________________   PROCEDURES CRITICAL CARE Performed by: Joni Fears, Engelbert Sevin   Total critical care time: 35 minutes  Critical care time was exclusive of separately billable procedures and treating other patients.  Critical care was necessary to treat or prevent imminent or life-threatening  deterioration.  Critical care was time spent personally by me on the following activities: development of treatment plan with patient and/or surrogate as well as nursing, discussions with consultants, evaluation of patient's response to treatment, examination of patient, obtaining history from patient or surrogate, ordering and performing treatments and interventions, ordering and review of laboratory studies, ordering and review of radiographic studies, pulse oximetry and re-evaluation of patient's condition.   ____________________________________________   INITIAL IMPRESSION / ASSESSMENT AND PLAN / ED COURSE  Pertinent labs & imaging results that were available during my care of the patient were reviewed by me and considered in my medical decision making (see chart for details).  Patient presents with syncope and symptoms and exam findings concerning for anemia. No other acute symptoms to suggest organ dysfunction such as cardiac ischemia. EKG is unremarkable at this time. We'll check  labs.  ----------------------------------------- 2:26 PM on 08/18/2015 -----------------------------------------  Informed by lab at 12:30 that the patient's hemoglobin had come back at 3.2. Because the patient's vital signs are unremarkable and at rest she is seems rather relatively comfortable, sent this for a recheck. However, the lab did not start running a repeat peak specimen until about 1:30 PM when I called them to inquire. This repeat is not resulted confirming her hemoglobin of about 3.2 or 3.4. I discussed this with the patient who provides verbal consent for blood transfusion. Case discussed with the hospitalist for admission. As her period seems to be abating on its own at this point, does not appear to require urgent GYN consultation for [facility].     ____________________________________________   FINAL CLINICAL IMPRESSION(S) / ED DIAGNOSES  Final diagnoses:  Symptomatic anemia  Microcytic  anemia  Syncope, unspecified syncope type      Carrie Mew, MD 08/18/15 1429

## 2015-08-19 ENCOUNTER — Inpatient Hospital Stay: Payer: BLUE CROSS/BLUE SHIELD

## 2015-08-19 DIAGNOSIS — D509 Iron deficiency anemia, unspecified: Secondary | ICD-10-CM

## 2015-08-19 DIAGNOSIS — N946 Dysmenorrhea, unspecified: Secondary | ICD-10-CM | POA: Diagnosis present

## 2015-08-19 DIAGNOSIS — D259 Leiomyoma of uterus, unspecified: Secondary | ICD-10-CM | POA: Diagnosis present

## 2015-08-19 DIAGNOSIS — E876 Hypokalemia: Secondary | ICD-10-CM | POA: Diagnosis present

## 2015-08-19 DIAGNOSIS — N920 Excessive and frequent menstruation with regular cycle: Secondary | ICD-10-CM | POA: Diagnosis present

## 2015-08-19 DIAGNOSIS — D696 Thrombocytopenia, unspecified: Secondary | ICD-10-CM | POA: Diagnosis present

## 2015-08-19 DIAGNOSIS — D649 Anemia, unspecified: Secondary | ICD-10-CM

## 2015-08-19 LAB — HEMOGLOBIN AND HEMATOCRIT, BLOOD
HEMATOCRIT: 23.5 % — AB (ref 35.0–47.0)
Hemoglobin: 7.3 g/dL — ABNORMAL LOW (ref 12.0–16.0)

## 2015-08-19 LAB — CBC
HCT: 18.7 % — ABNORMAL LOW (ref 35.0–47.0)
Hemoglobin: 5.6 g/dL — ABNORMAL LOW (ref 12.0–16.0)
MCH: 20 pg — AB (ref 26.0–34.0)
MCHC: 29.9 g/dL — ABNORMAL LOW (ref 32.0–36.0)
MCV: 66.8 fL — AB (ref 80.0–100.0)
PLATELETS: 70 10*3/uL — AB (ref 150–440)
RBC: 2.79 MIL/uL — AB (ref 3.80–5.20)
RDW: 39.7 % — AB (ref 11.5–14.5)
WBC: 11.5 10*3/uL — ABNORMAL HIGH (ref 3.6–11.0)

## 2015-08-19 LAB — BASIC METABOLIC PANEL
Anion gap: 3 — ABNORMAL LOW (ref 5–15)
BUN: 12 mg/dL (ref 6–20)
CO2: 21 mmol/L — AB (ref 22–32)
Calcium: 7.9 mg/dL — ABNORMAL LOW (ref 8.9–10.3)
Chloride: 109 mmol/L (ref 101–111)
Creatinine, Ser: 0.59 mg/dL (ref 0.44–1.00)
GFR calc Af Amer: >60 mL/min (ref >60)
GLUCOSE: 117 mg/dL — AB (ref 65–99)
POTASSIUM: 3.9 mmol/L (ref 3.5–5.1)
Sodium: 133 mmol/L — ABNORMAL LOW (ref 135–145)

## 2015-08-19 LAB — VITAMIN B12: Vitamin B-12: 454 pg/mL (ref 180–914)

## 2015-08-19 LAB — PREPARE RBC (CROSSMATCH)

## 2015-08-19 MED ORDER — FERROUS SULFATE 325 (65 FE) MG PO TABS: 325.0000 mg | ORAL_TABLET | Freq: Two times a day (BID) | ORAL | Status: AC

## 2015-08-19 MED ORDER — NORETHINDRONE 0.35 MG PO TABS: 0.3500 mg | ORAL_TABLET | Freq: Every day | ORAL | Status: DC

## 2015-08-19 MED ORDER — SODIUM CHLORIDE 0.9 % IV SOLN
Freq: Once | INTRAVENOUS | Status: AC
  Administered 2015-08-19: 06:00:00 via INTRAVENOUS

## 2015-08-19 NOTE — Discharge Instructions (Signed)
Abnormal Uterine Bleeding Abnormal uterine bleeding means bleeding from the vagina that is not your normal menstrual period. This can be:  Bleeding or spotting between periods.  Bleeding after sex (sexual intercourse).  Bleeding that is heavier or more than normal.  Periods that last longer than usual.  Bleeding after menopause. There are many problems that may cause this. Treatment will depend on the cause of the bleeding. Any kind of bleeding that is not normal should be reviewed by your doctor.  HOME CARE Watch your condition for any changes. These actions may lessen any discomfort you are having:  Do not use tampons or douches as told by your doctor.  Change your pads often. You should get regular pelvic exams and Pap tests. Keep all appointments for tests as told by your doctor. GET HELP IF:  You are bleeding for more than 1 week.  You feel dizzy at times. GET HELP RIGHT AWAY IF:   You pass out.  You have to change pads every 15 to 30 minutes.  You have belly pain.  You have a fever.  You become sweaty or weak.  You are passing large blood clots from the vagina.  You feel sick to your stomach (nauseous) and throw up (vomit). MAKE SURE YOU:  Understand these instructions.  Will watch your condition.  Will get help right away if you are not doing well or get worse.   This information is not intended to replace advice given to you by your health care provider. Make sure you discuss any questions you have with your health care provider.   Document Released: 03/12/2009 Document Revised: 05/20/2013 Document Reviewed: 12/12/2012 Elsevier Interactive Patient Education 2016 [employer].  Anemia, Nonspecific Anemia is a condition in which the concentration of red blood cells or hemoglobin in the blood is below normal. Hemoglobin is a substance in red blood cells that carries oxygen to the tissues of the body. Anemia results in not enough oxygen reaching these  tissues.  CAUSES  Common causes of anemia include:   Excessive bleeding. Bleeding may be internal or external. This includes excessive bleeding from periods (in women) or from the intestine.   Poor nutrition.   Chronic kidney, thyroid, and liver disease.  Bone marrow disorders that decrease red blood cell production.  Cancer and treatments for cancer.  HIV, AIDS, and their treatments.  Spleen problems that increase red blood cell destruction.  Blood disorders.  Excess destruction of red blood cells due to infection, medicines, and autoimmune disorders. SIGNS AND SYMPTOMS   Minor weakness.   Dizziness.   Headache.  Palpitations.   Shortness of breath, especially with exercise.   Paleness.  Cold sensitivity.  Indigestion.  Nausea.  Difficulty sleeping.  Difficulty concentrating. Symptoms may occur suddenly or they may develop slowly.  DIAGNOSIS  Additional blood tests are often needed. These help your health care provider determine the best treatment. Your health care provider will check your stool for blood and look for other causes of blood loss.  TREATMENT  Treatment varies depending on the cause of the anemia. Treatment can include:   Supplements of iron, vitamin W80, or folic acid.   Hormone medicines.   A blood transfusion. This may be needed if blood loss is severe.   Hospitalization. This may be needed if there is significant continual blood loss.   Dietary changes.  Spleen removal. HOME CARE INSTRUCTIONS Keep all follow-up appointments. It often takes many weeks to correct anemia, and having your health care  provider check on your condition and your response to treatment is very important. SEEK IMMEDIATE MEDICAL CARE IF:   You develop extreme weakness, shortness of breath, or chest pain.   You become dizzy or have trouble concentrating.  You develop heavy vaginal bleeding.   You develop a rash.   You have bloody or black,  tarry stools.   You faint.   You vomit up blood.   You vomit repeatedly.   You have abdominal pain.  You have a fever or persistent symptoms for more than 2-3 days.   You have a fever and your symptoms suddenly get worse.   You are dehydrated.  MAKE SURE YOU:  Understand these instructions.  Will watch your condition.  Will get help right away if you are not doing well or get worse.   This information is not intended to replace advice given to you by your health care provider. Make sure you discuss any questions you have with your health care provider.   Document Released: 06/22/2004 Document Revised: 01/15/2013 Document Reviewed: 11/08/2012 Elsevier Interactive Patient Education 2016 Elsevier Inc[employer]  Abnormal Uterine Bleeding Abnormal uterine bleeding can affect women at various stages in life, including teenagers, women in their reproductive years, pregnant women, and women who have reached menopause. Several kinds of uterine bleeding are considered abnormal, including:  Bleeding or spotting between periods.   Bleeding after sexual intercourse.   Bleeding that is heavier or more than normal.   Periods that last longer than usual.  Bleeding after menopause.  Many cases of abnormal uterine bleeding are minor and simple to treat, while others are more serious. Any type of abnormal bleeding should be evaluated by your health care provider. Treatment will depend on the cause of the bleeding. HOME CARE INSTRUCTIONS Monitor your condition for any changes. The following actions may help to alleviate any discomfort you are experiencing:  Avoid the use of tampons and douches as directed by your health care provider.  Change your pads frequently. You should get regular pelvic exams and Pap tests. Keep all follow-up appointments for diagnostic tests as directed by your health care provider.  SEEK MEDICAL CARE IF:   Your bleeding lasts more than 1 week.   You feel  dizzy at times.  SEEK IMMEDIATE MEDICAL CARE IF:   You pass out.   You are changing pads every 15 to 30 minutes.   You have abdominal pain.  You have a fever.   You become sweaty or weak.   You are passing large blood clots from the vagina.   You start to feel nauseous and vomit. MAKE SURE YOU:   Understand these instructions.  Will watch your condition.  Will get help right away if you are not doing well or get worse.   This information is not intended to replace advice given to you by your health care provider. Make sure you discuss any questions you have with your health care provider.   Document Released: 05/15/2005 Document Revised: 05/20/2013 Document Reviewed: 12/12/2012 Elsevier Interactive Patient Education [employer].

## 2015-08-19 NOTE — Discharge Summary (Signed)
[facility] [place] NAME: Krista Fernandez    MR#:  [id number]  DATE OF BIRTH:  1971/04/27  DATE OF ADMISSION:  08/18/2015 ADMITTING PHYSICIAN: Gladstone Lighter, MD  DATE OF DISCHARGE: 07/22/15  PRIMARY CARE PHYSICIAN: Ashok Norris, MD    ADMISSION DIAGNOSIS:  Microcytic anemia [D50.9] Symptomatic anemia [D64.9] Syncope, unspecified syncope type [R55]  DISCHARGE DIAGNOSIS:  Syncopal episode secondary to severe menorrhagia and symptomatic anemia-further workup as outpatient by her primary gynecologists [place].  SECONDARY DIAGNOSIS:   Past Medical History  Diagnosis Date  . Dysmenorrhea     HOSPITAL COURSE:  Krista Fernandez is a 45 y.o. female with No significant past medical history but has been having dysmenorrhea for the last 6 months presents to the hospital secondary to a syncopal episode.  #1 anemia-acute on chronic due to heavy menstrual loss. -No baseline hemoglobin available. MCV is very low at 58. Likely iron deficiency anemia. - status post 3 units transfusion . Patient came in with hemoglobin of 3.4 she is up to 7.3 -Also started on iron supplements. -Anemia labs panel ordered. GYN follow-up. She was seen and seen by GYN recommends pelvic ultrasound which has been done and results pending. She'll follow up with her primary GYN in [place]. Patient has been started on low dose progesterone pills 0.35 mg by mouth daily -Vitals are stable.  #2 dysmenorrhea-could be fibroids. Follows with OB/GYN in [place]. -GYN consult in the hospital. Likely transvaginal ultrasound needed for fibroids. Workup can be done as an outpatient.  #3 thrombocytopenia-no prior platelet count to compare with. Send platelet antibodies. This seems likely due to severe anemia -Continue to monitor at this time. No indication for platelet transfusion  #4 hypokalemia-being replaced  #5 DVT prophylaxis-Ted's and SCDs. Also patient is  ambulatory  Overall stable will discharge patient to home  CONSULTS OBTAINED:  Treatment Team:  Rubie Maid, MD  DRUG ALLERGIES:  No Known Allergies  DISCHARGE MEDICATIONS:   Current Discharge Medication List    START taking these medications   Details  ferrous sulfate 325 (65 FE) MG tablet Take 1 tablet (325 mg total) by mouth 2 (two) times daily with a meal. Qty: 60 tablet, Refills: 3      CONTINUE these medications which have NOT CHANGED   Details  Multiple Vitamin (MULTIVITAMIN WITH MINERALS) TABS tablet Take 1 tablet by mouth daily.        If you experience worsening of your admission symptoms, develop shortness of breath, life threatening emergency, suicidal or homicidal thoughts you must seek medical attention immediately by calling 911 or calling your MD immediately  if symptoms less severe.  You Must read complete instructions/literature along with all the possible adverse reactions/side effects for all the Medicines you take and that have been prescribed to you. Take any new Medicines after you have completely understood and accept all the possible adverse reactions/side effects.   Please note  You were cared for by a hospitalist during your hospital stay. If you have any questions about your discharge medications or the care you received while you were in the hospital after you are discharged, you can call the unit and asked to speak with the hospitalist on call if the hospitalist that took care of you is not available. Once you are discharged, your primary care physician will handle any further medical issues. Please note that NO REFILLS for any discharge medications will be authorized once you are discharged, as it is imperative  that you return to your primary care physician (or establish a relationship with a primary care physician if you do not have one) for your aftercare needs so that they can reassess your need for medications and monitor your lab values. Today    SUBJECTIVE   Patient feels better no complaints  VITAL SIGNS:  Blood pressure 106/61, pulse 70, temperature 98.4 F (36.9 C), temperature source Oral, resp. rate 18, height 5\' 6"  (1.676 m), weight 61.689 kg (136 lb), last menstrual period 08/12/2015, SpO2 100 %.  I/O:   Intake/Output Summary (Last 24 hours) at 08/19/15 1619 Last data filed at 08/19/15 1042  Gross per 24 hour  Intake   2030 ml  Output    750 ml  Net   1280 ml    PHYSICAL EXAMINATION:  GENERAL:  45 y.o.-year-old patient lying in the bed with no acute distress.  EYES: Pupils equal, round, reactive to light and accommodation. No scleral icterus. Extraocular muscles intact.  HEENT: Head atraumatic, normocephalic. Oropharynx and nasopharynx clear.  NECK:  Supple, no jugular venous distention. No thyroid enlargement, no tenderness.  LUNGS: Normal breath sounds bilaterally, no wheezing, rales,rhonchi or crepitation. No use of accessory muscles of respiration.  CARDIOVASCULAR: S1, S2 normal. No murmurs, rubs, or gallops.  ABDOMEN: Soft, non-tender, non-distended. Bowel sounds present. No organomegaly or mass.  EXTREMITIES: No pedal edema, cyanosis, or clubbing.  NEUROLOGIC: Cranial nerves II through XII are intact. Muscle strength 5/5 in all extremities. Sensation intact. Gait not checked.  PSYCHIATRIC: The patient is alert and oriented x 3.  SKIN: No obvious rash, lesion, or ulcer.   DATA REVIEW:   CBC   Recent Labs Lab 08/19/15 0312 08/19/15 0926  WBC 11.5*  --   HGB 5.6* 7.3*  HCT 18.7* 23.5*  PLT 70*  --     Chemistries   Recent Labs Lab 08/19/15 0312  NA 133*  K 3.9  CL 109  CO2 21*  GLUCOSE 117*  BUN 12  CREATININE 0.59  CALCIUM 7.9*    Microbiology Results   No results found for this or any previous visit (from the past 240 hour(s)).  RADIOLOGY:  No results found.   Management plans discussed with the patient, family and they are in agreement.  CODE STATUS:     Code Status  Orders        Start     Ordered   08/18/15 1620  Full code   Continuous     08/18/15 1619    Code Status History    Date Active Date Inactive Code Status Order ID Comments User Context   This patient has a current code status but no historical code status.      TOTAL TIME TAKING CARE OF THIS PATIENT: 45minutes.    Melayah Skorupski M.D on 08/19/2015 at 4:19 PM  Between 7am to 6pm - Pager - [phone] After 6pm go to [email or web address] - password [email or web address] [facility]  Office  [phone]  CC: Primary care physician; Ashok Norris, MD

## 2015-08-19 NOTE — Progress Notes (Signed)
Patient understands all discharge instructions and the need to make follow up appointments. Patient discharge via wheelchair with auxillary. 

## 2015-08-19 NOTE — Consult Note (Addendum)
GYNECOLOGY CONSULT HISTORY AND PHYSICAL  Reason for Consult: Heavy menstrual periods, severe anemia requiring blood transfusion, dysmenorrhea Referring Physician: Dr. Gladstone Lighter  Krista Fernandez is an 45 y.o. P95 female who was admitted yesterday for syncopal episode, likely secondary to severe anemia.  Patient notes that over the past year, her periods have become heavier, however have still been heavier and occurring regularly. Reports mild dysmenorrhea, usually managed with OTC pain meds. Notes mentioning this to her GYN in [place], however states that she was told that it could just be watched.  Notes that this current period, she was having to change pads q 1.5-2 hours, using a super pad and tampon.  States that her periods are usally the heaviest between days 2-4.  Reports that on the day of admission, she was feeling dizzy and lightheaded, and then passed out.  Per patient's husband, she had LOC for ~ 2 minutes. Hemoglobin at arrival was 3.4, and platelets 65K.  LMP was 08/12/2015.   Patient was admitted for cardiac monitoring and blood transfusion for anemia and syncopal episode.  Patient notes that she is feeling better today.  Is currently receiving 3rd unit of PRBCs.   Pertinent Gynecological History: Menses: flow is excessive with use of 8-10 super pads or tampons on heaviest days (Days 2-4).  Mnestrual cycles last ~ 7-8 days Bleeding: Regular menses, occuring q 30 days Contraception: none Blood transfusions: currently receiving.  Sexually transmitted diseases: no past history Last mammogram: normal Date: 07/2014 Last pap: normal Date: 07/2014 OB History: G4, P3   Menstrual History: Menarche age: 80 Patient's last menstrual period was 08/12/2015.    Past Medical History  Diagnosis Date  . Dysmenorrhea     Past Surgical History  Procedure Laterality Date  . Dilation and curettage of uterus      Family History  Problem Relation Age of Onset  . Liver cancer Mother    . CVA Father    Social History   Social History  . Marital Status: Married    Spouse Name: N/A  . Number of Children: N/A  . Years of Education: N/A   Occupational History  . Not on file.   Social History Main Topics  . Smoking status: Never Smoker   . Smokeless tobacco: Not on file  . Alcohol Use: No  . Drug Use: No  . Sexual Activity: Not on file   Other Topics Concern  . Not on file   Social History Narrative   Lives at home with Husband and children. Stay at home Mom    No current facility-administered medications on file prior to encounter.   No current outpatient prescriptions on file prior to encounter.     No Known Allergies   Review of Systems  Constitutional: Positive for malaise/fatigue. Negative for fever and chills.  HENT: Negative for nosebleeds and sore throat.   Eyes: Negative for blurred vision and discharge.  Respiratory: Negative for cough, sputum production and shortness of breath.   Cardiovascular: Negative for chest pain, palpitations and leg swelling.  Gastrointestinal: Negative for nausea, vomiting, abdominal pain, diarrhea and constipation.  Genitourinary: Negative for dysuria and flank pain.  Musculoskeletal: Negative for myalgias, back pain and neck pain.  Skin: Negative for itching and rash.  Neurological: Positive for dizziness, loss of consciousness and weakness. Negative for tingling, focal weakness, seizures and headaches.  Endo/Heme/Allergies: Negative for polydipsia. Does not bruise/bleed easily.  Psychiatric/Behavioral: Negative for depression, suicidal ideas and substance abuse. The patient does not have  insomnia.     Blood pressure 123/72, pulse 73, temperature 98.3 F (36.8 C), temperature source Oral, resp. rate 17, height _0  (1.676 m), weight 136 lb (61.689 kg), last menstrual period 08/18/2015, SpO2 100 %. Physical Exam  Constitutional: She is oriented to person, place, and time. She appears well-developed and  well-nourished.  HENT:  Head: Normocephalic and atraumatic.  Right Ear: External ear normal.  Left Ear: External ear normal.  Nose: Nose normal.  Mouth/Throat: Oropharynx is clear and moist.  Eyes: Pupils are equal, round, and reactive to light. Right eye exhibits no discharge. Left eye exhibits no discharge. No scleral icterus.  Neck: Normal range of motion. Neck supple. No JVD present. No tracheal deviation present. No thyromegaly present.  Cardiovascular: Normal rate and regular rhythm.  Exam reveals no gallop and no friction rub.   No murmur heard. Respiratory: Effort normal and breath sounds normal. No respiratory distress.  GI: Soft. Bowel sounds are normal. She exhibits no distension and no mass. There is no tenderness.  Genitourinary: Vagina normal and uterus normal. No vaginal discharge found.  Dark blood in vaginal vault  Musculoskeletal: Normal range of motion. She exhibits no edema or tenderness.  Lymphadenopathy:    She has no cervical adenopathy.  Neurological: She is alert and oriented to person, place, and time.  Skin: Skin is warm and dry. No rash noted. No erythema.  Psychiatric: She has a normal mood and affect. Her behavior is normal. Thought content normal.    Results for orders placed or performed during the hospital encounter of 08/18/15 (from the past 48 hour(s))  Basic metabolic panel     Status: Abnormal   Collection Time: 08/18/15 11:45 AM  Result Value Ref Range   Sodium 132 (L) 135 - 145 mmol/L   Potassium 3.2 (L) 3.5 - 5.1 mmol/L   Chloride 105 101 - 111 mmol/L   CO2 22 22 - 32 mmol/L   Glucose, Bld 102 (H) 65 - 99 mg/dL   BUN 12 6 - 20 mg/dL   Creatinine, Ser 0.62 0.44 - 1.00 mg/dL   Calcium 8.6 (L) 8.9 - 10.3 mg/dL   GFR calc non Af Amer >60 >60 mL/min   GFR calc Af Amer >60 >60 mL/min    Comment: (NOTE) The eGFR has been calculated using the CKD EPI equation. This calculation has not been validated in all clinical situations. eGFR's  persistently <60 mL/min signify possible Chronic Kidney Disease.    Anion gap 5 5 - 15  Troponin I     Status: None   Collection Time: 08/18/15 11:45 AM  Result Value Ref Range   Troponin I <0.03 <0.031 ng/mL    Comment:        NO INDICATION OF MYOCARDIAL INJURY.   Folate     Status: None   Collection Time: 08/18/15 11:45 AM  Result Value Ref Range   Folate 31.0 >5.9 ng/mL  Iron and TIBC     Status: Abnormal   Collection Time: 08/18/15 11:45 AM  Result Value Ref Range   Iron 11 (L) 28 - 170 ug/dL   TIBC 424 250 - 450 ug/dL   Saturation Ratios 3 (L) 10.4 - 31.8 %   UIBC 413 ug/dL  Ferritin     Status: Abnormal   Collection Time: 08/18/15 11:45 AM  Result Value Ref Range   Ferritin 5 (L) 11 - 307 ng/mL  Glucose, capillary     Status: None   Collection Time: 08/18/15 11:50 AM  Result Value Ref Range   Glucose-Capillary 92 65 - 99 mg/dL  Urinalysis complete, with microscopic ([facility] only)     Status: Abnormal   Collection Time: 08/18/15 12:38 PM  Result Value Ref Range   Color, Urine STRAW (A) YELLOW   APPearance CLEAR (A) CLEAR   Glucose, UA NEGATIVE NEGATIVE mg/dL   Bilirubin Urine NEGATIVE NEGATIVE   Ketones, ur NEGATIVE NEGATIVE mg/dL   Specific Gravity, Urine 1.003 (L) 1.005 - 1.030   Hgb urine dipstick 2+ (A) NEGATIVE   pH 6.0 5.0 - 8.0   Protein, ur NEGATIVE NEGATIVE mg/dL   Nitrite NEGATIVE NEGATIVE   Leukocytes, UA NEGATIVE NEGATIVE   RBC / HPF NONE SEEN 0 - 5 RBC/hpf   WBC, UA 0-5 0 - 5 WBC/hpf   Bacteria, UA NONE SEEN NONE SEEN   Squamous Epithelial / LPF 0-5 (A) NONE SEEN  Pregnancy, urine     Status: None   Collection Time: 08/18/15 12:38 PM  Result Value Ref Range   Preg Test, Ur NEGATIVE NEGATIVE  CBC with Differential     Status: Abnormal   Collection Time: 08/18/15  1:25 PM  Result Value Ref Range   WBC 8.8 3.6 - 11.0 K/uL   RBC 2.32 (L) 3.80 - 5.20 MIL/uL   Hemoglobin 3.4 (LL) 12.0 - 16.0 g/dL    Comment: CRITICAL RESULT CALLED TO, READ BACK BY  AND VERIFIED WITH: AMY TEAGUE RN AT 1405 08/18/15 MSS.    HCT 13.4 (LL) 35.0 - 47.0 %    Comment: CRITICAL RESULT CALLED TO, READ BACK BY AND VERIFIED WITH: AMY TEAGUE RN AT 9292 08/18/15 MSS.    MCV 58.0 (L) 80.0 - 100.0 fL   MCH 14.7 (L) 26.0 - 34.0 pg   MCHC 25.4 (L) 32.0 - 36.0 g/dL   RDW 34.3 (H) 11.5 - 14.5 %   Platelets 65 (L) 150 - 440 K/uL   Neutrophils Relative % 83 %   Lymphocytes Relative 15 %   Monocytes Relative 2 %   Eosinophils Relative 0 %   Basophils Relative 0 %   Band Neutrophils 0 %   Metamyelocytes Relative 0 %   Myelocytes 0 %   Promyelocytes Absolute 0 %   Blasts 0 %   nRBC 0 0 /100 WBC   Other 0 %   Neutro Abs 7.3 (H) 1.4 - 6.5 K/uL   Lymphs Abs 1.3 1.0 - 3.6 K/uL   Monocytes Absolute 0.2 0.2 - 0.9 K/uL   Eosinophils Absolute 0.0 0 - 0.7 K/uL   Basophils Absolute 0.0 0 - 0.1 K/uL   RBC Morphology MARKED SCHISTOCYTES (>10/hpf)     Comment: MIXED RBC POPULATION  Reticulocytes     Status: Abnormal   Collection Time: 08/18/15  1:25 PM  Result Value Ref Range   Retic Ct Pct 3.1 0.4 - 3.1 %   RBC. 2.35 (L) 3.80 - 5.20 MIL/uL   Retic Count, Manual 72.9 19.0 - 183.0 K/uL  Type and screen [facility]     Status: None (Preliminary result)   Collection Time: 08/18/15  2:53 PM  Result Value Ref Range   ABO/RH(D) B POS    Antibody Screen NEG    Sample Expiration 08/21/2015    Unit Number [id number]    Blood Component Type RED CELLS,LR    Unit division 00    Status of Unit ISSUED    Transfusion Status OK TO TRANSFUSE    Crossmatch Result Compatible    Unit  Number [id number]    Blood Component Type RED CELLS,LR    Unit division 00    Status of Unit ISSUED    Transfusion Status OK TO TRANSFUSE    Crossmatch Result Compatible    Unit Number [id number]    Blood Component Type RED CELLS,LR    Unit division 00    Status of Unit ISSUED    Transfusion Status OK TO TRANSFUSE    Crossmatch Result Compatible    Unit Number  [id number]    Blood Component Type RED CELLS,LR    Unit division 00    Status of Unit ALLOCATED    Transfusion Status OK TO TRANSFUSE    Crossmatch Result Compatible   Prepare RBC     Status: None   Collection Time: 08/18/15  2:53 PM  Result Value Ref Range   Order Confirmation ORDER PROCESSED BY BLOOD BANK   ABO/Rh     Status: None   Collection Time: 08/18/15  2:54 PM  Result Value Ref Range   ABO/RH(D) B POS   CBC     Status: Abnormal   Collection Time: 08/19/15  3:12 AM  Result Value Ref Range   WBC 11.5 (H) 3.6 - 11.0 K/uL   RBC 2.79 (L) 3.80 - 5.20 MIL/uL   Hemoglobin 5.6 (L) 12.0 - 16.0 g/dL   HCT 18.7 (L) 35.0 - 47.0 %   MCV 66.8 (L) 80.0 - 100.0 fL   MCH 20.0 (L) 26.0 - 34.0 pg   MCHC 29.9 (L) 32.0 - 36.0 g/dL   RDW 39.7 (H) 11.5 - 14.5 %   Platelets 70 (L) 150 - 440 K/uL    Comment: PLATELET COUNT CONFIRMED BY SMEAR  Basic metabolic panel     Status: Abnormal   Collection Time: 08/19/15  3:12 AM  Result Value Ref Range   Sodium 133 (L) 135 - 145 mmol/L   Potassium 3.9 3.5 - 5.1 mmol/L   Chloride 109 101 - 111 mmol/L   CO2 21 (L) 22 - 32 mmol/L   Glucose, Bld 117 (H) 65 - 99 mg/dL   BUN 12 6 - 20 mg/dL   Creatinine, Ser 0.59 0.44 - 1.00 mg/dL   Calcium 7.9 (L) 8.9 - 10.3 mg/dL   GFR calc non Af Amer >60 >60 mL/min   GFR calc Af Amer >60 >60 mL/min    Comment: (NOTE) The eGFR has been calculated using the CKD EPI equation. This calculation has not been validated in all clinical situations. eGFR's persistently <60 mL/min signify possible Chronic Kidney Disease.    Anion gap 3 (L) 5 - 15  Prepare RBC     Status: None   Collection Time: 08/19/15  5:30 AM  Result Value Ref Range   Order Confirmation ORDER PROCESSED BY BLOOD BANK      Assessment/Plan::  1. Heavy menstrual bleeding (menorrhagia)  - Patient notes that this has been ongoing for ~ 1 year, and has not had any major problems until most recent cycle. Discussed that due to heavy menses, she should  be on some sort of hormonal regimen to decrease menstrual flow and thus decrease of recurrent episodes of severe anemia requiring hospitalization and blood transfusion.  Patient notes that her current menstrual cycle is coming to an end (is ~ the 8th day, flow is light).  Also discussed briefly other management options, including medical (Mirena IUD, Depo Provera), and surgical (D&C, endometrial ablation, hysterectomy).  Patient would like some time to think about her decision, but  is ok to begin progesterone-only pills in interim (Micronor, or Camila).  Can initiate today, and continue outpatient.  Patient needs to f/u with either her primary GYN, or schedule an appointment at [facility] for further discussion of long term management.   Will also have patient receive pelvic ultrasound prior to discharge to assess pelvic anatomy.  2. Severe anemia  - Patient currently receiving 3rd, of likely 4 units of PRBCs.  Will recheck Hgb post-transfusion.  As long as it is a 7 or above, patient can continue PO iron supplementation TID as outpatient.  3. Thrombocytopenia - Unknown cause.  Patient currently on no medications that could cause, and has no prior h/o bleeding disorders.  May be possibly ITP, or just due to excessive blood loss.  Would recommend f/u with Hematology at some point.  Should not cause any issues of spontaneous bleeding at current level.   Thank you for the consult.  If patient is discharged home today, can follow up with primary GYN in [place] as soon as possible, or be scheduled with [facility].     Rubie Maid, MD [facility]

## 2015-08-21 LAB — TYPE AND SCREEN
ABO/RH(D): B POS
Antibody Screen: NEGATIVE
Unit division: 0
Unit division: 0
Unit division: 0
Unit division: 0

## 2017-07-17 IMAGING — US US TRANSVAGINAL NON-OB
1 series · 13 of 25 positions shown · non-contrast
Comparison: 05/15/2004 obstetric scan.

CLINICAL DATA: 45-year-old female with menorrhagia for 1 year. LMP
08/10/2015.

EXAM:
TRANSABDOMINAL AND TRANSVAGINAL ULTRASOUND OF PELVIS
TECHNIQUE: Both transabdominal and transvaginal ultrasound examinations of the
pelvis were performed. Transabdominal technique was performed for
global imaging of the pelvis including uterus, ovaries, adnexal
regions, and pelvic cul-de-sac. It was necessary to proceed with
endovaginal exam following the transabdominal exam to visualize the
endometrium, myometrium and adnexa.

[Series 1: us transvaginal non-ob · 0.24mm/px · 13 of 102 slices shown]
[im 1/102]
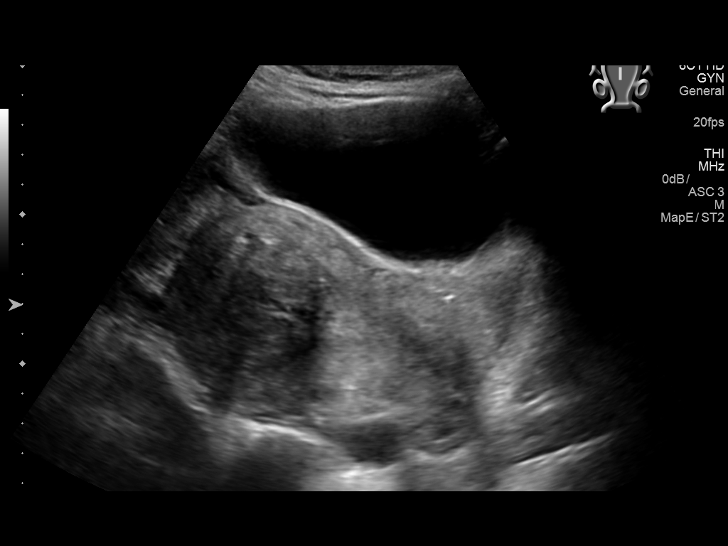
[im 9/102]
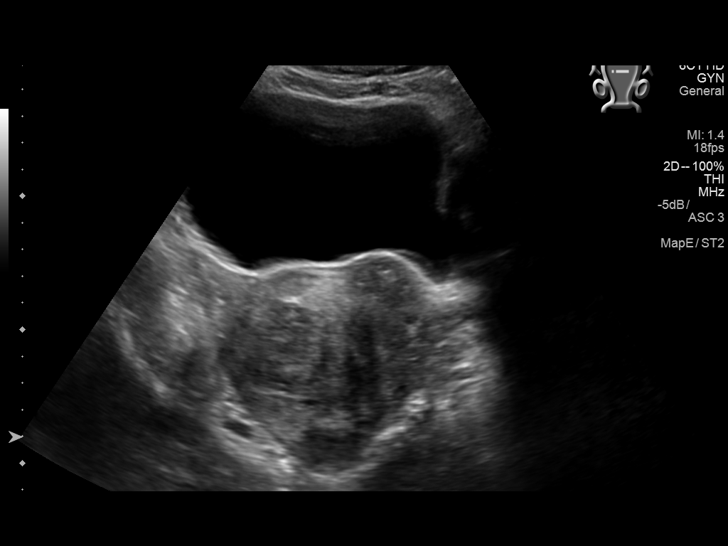
[im 17/102]
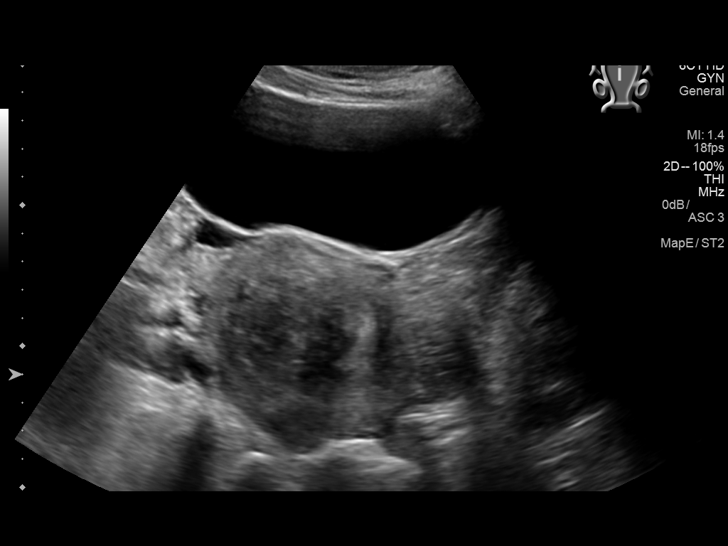
[im 26/102]
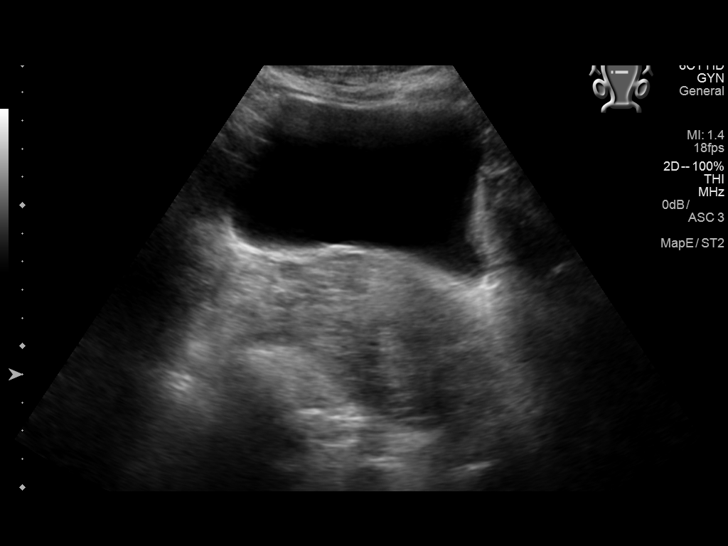
[im 34/102]
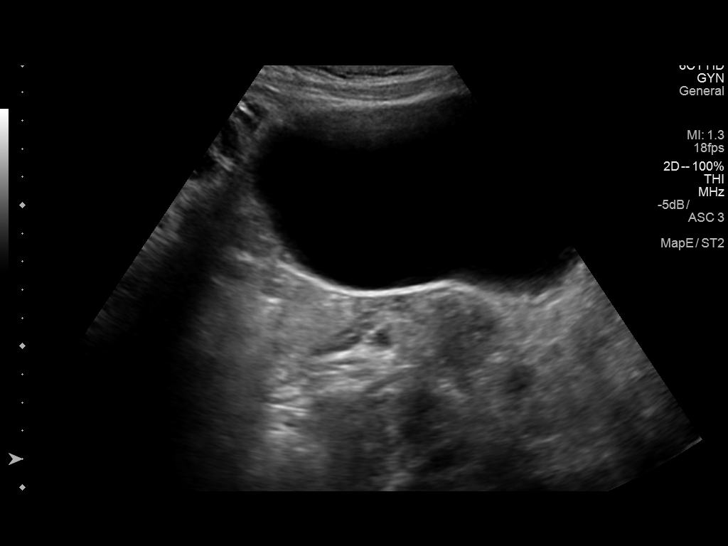
[im 43/102]
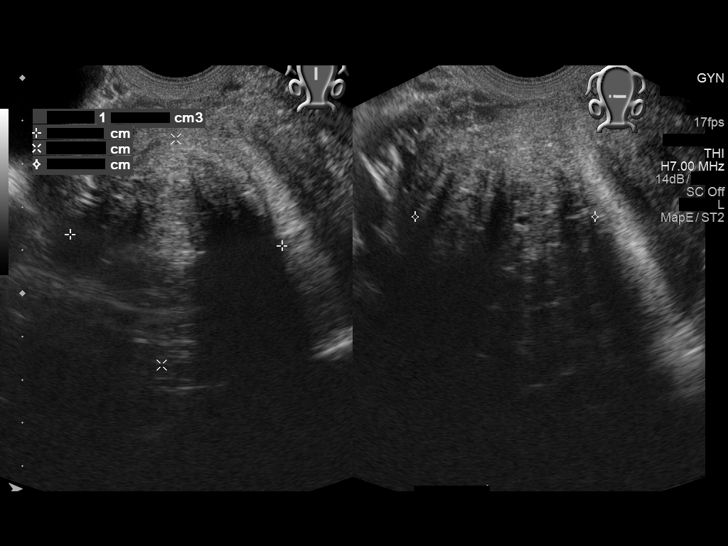
[im 51/102]
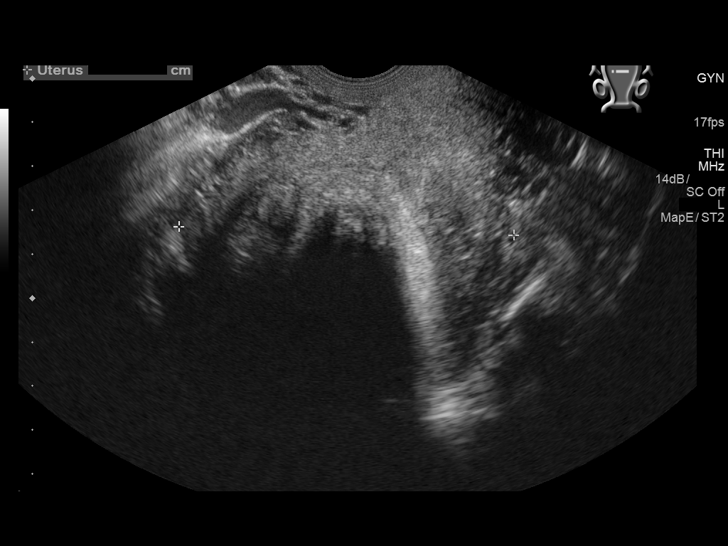
[im 59/102]
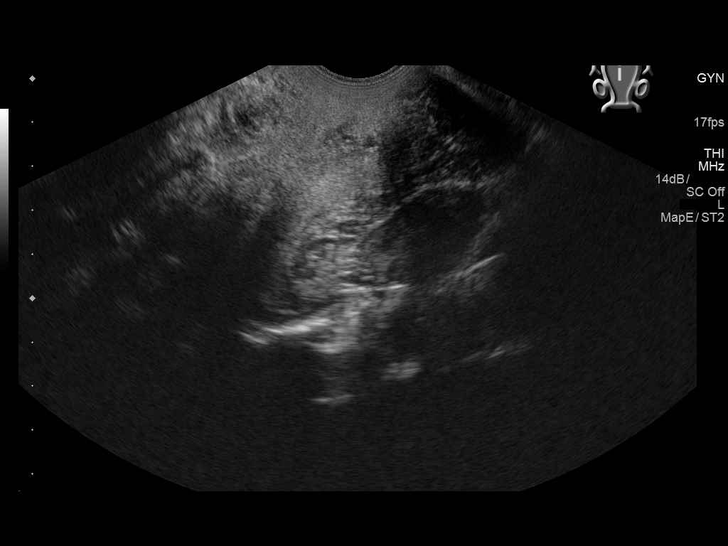
[im 68/102]
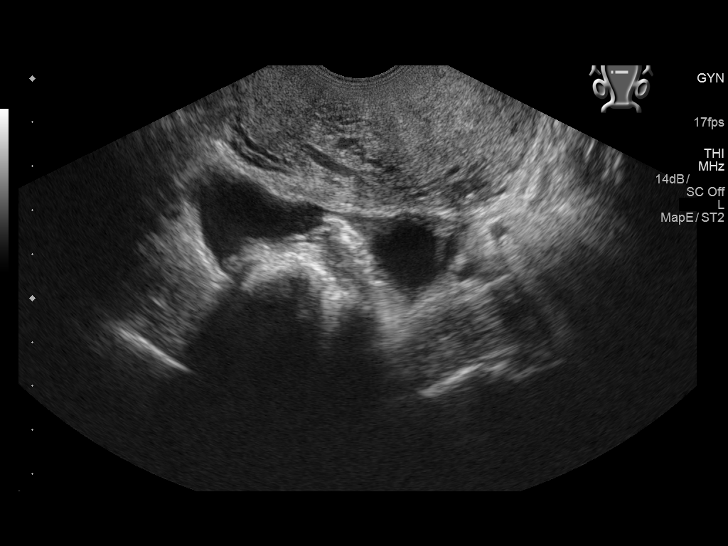
[im 76/102]
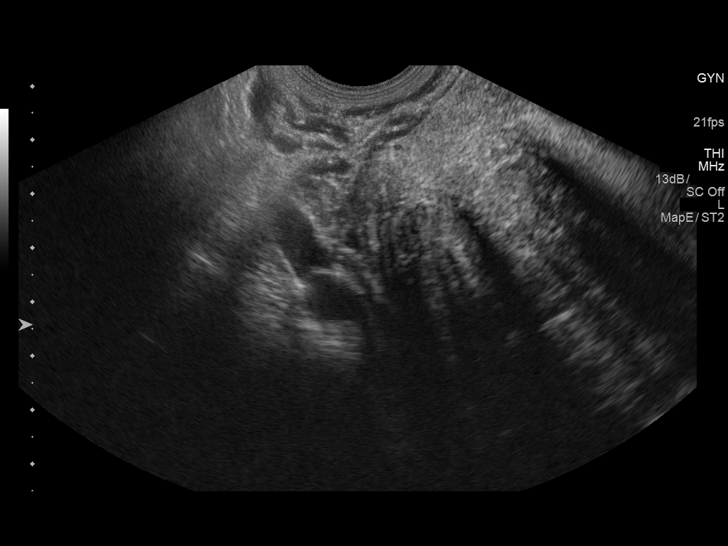
[im 85/102]
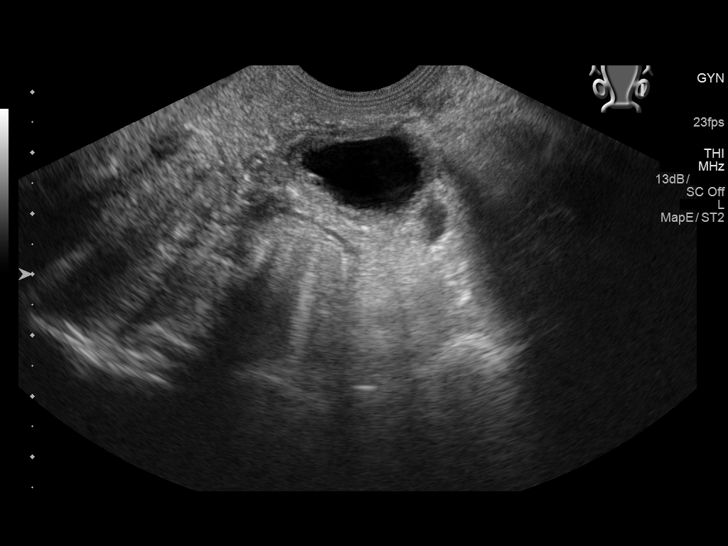
[im 93/102]
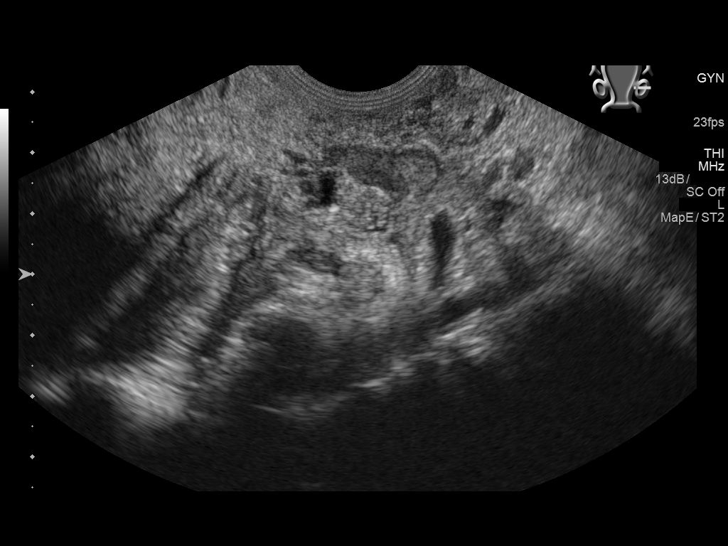
[im 102/102]
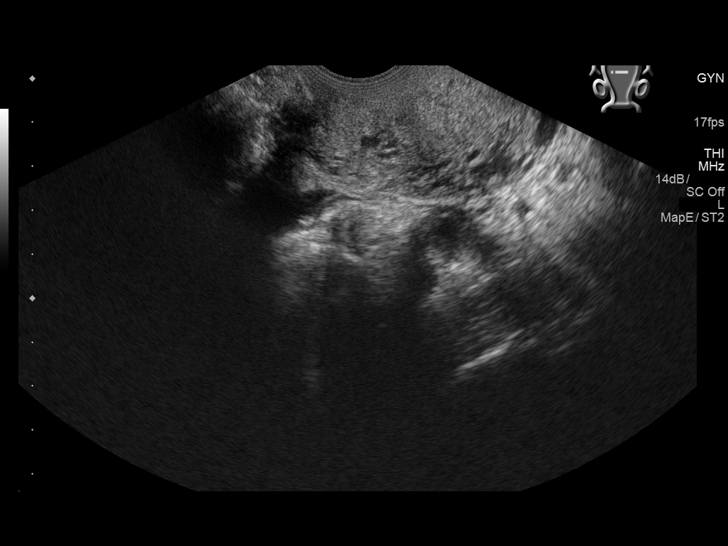

[13 of 25 positions shown; findings below may reference images not displayed]

FINDINGS: Uterus

Measurements: 10.9 x 7.1 x 8.1 cm. The anteverted uterus is enlarged
by multiple fibroids, with representative fibroids as follows:

- right posterior upper uterine body 4.9 x 5.3 x 4.2 cm transmural
fibroid, not previously visualized, which may contain a significant
submucosal component

- left anterior uterine body intramural 2.2 x 1.4 x 2.1 cm fibroid,
which likely correlates with the 0.8 x 1.1 x 1.1 cm fibroid from the
05/15/2004 obstetric scan, representing mild interval growth

Endometrium

Endometrium is significantly distorted by the large transmural right
posterior upper uterine body fibroid, and cannot be accurately
measured at sonography. No endometrial cavity fluid or focal
endometrial mass demonstrated.

Right ovary

Nonvisualization of the right ovary.  No right adnexal mass.

Left ovary

Measurements: 4.2 x 2.3 x 3.0 cm. Dominant simple 2.2 cm left
ovarian follicle. No suspicious left ovarian or left adnexal masses.

Other findings

No abnormal free fluid.
IMPRESSION: 1. Enlarged anteverted myomatous uterus, with a dominant 5.3 cm
right posterior upper uterine body transmural fibroid, which may
contain a significant submucosal component.
2. Nondiagnostic evaluation of the endometrium due to significant
distortion by the dominant fibroid.
3. Nonvisualization of the right ovary. Normal left ovary. No
suspicious adnexal findings.

## 2019-07-10 ENCOUNTER — Encounter: Payer: Self-pay | Admitting: Obstetrics and Gynecology

## 2019-07-10 ENCOUNTER — Other Ambulatory Visit: Payer: Self-pay

## 2019-07-10 ENCOUNTER — Ambulatory Visit (INDEPENDENT_AMBULATORY_CARE_PROVIDER_SITE_OTHER): Payer: 59 | Admitting: Obstetrics and Gynecology

## 2019-07-10 VITALS — BP 122/77 | HR 96 | Ht 66.0 in | Wt 142.0 lb

## 2019-07-10 DIAGNOSIS — Z01419 Encounter for gynecological examination (general) (routine) without abnormal findings: Secondary | ICD-10-CM

## 2019-07-10 DIAGNOSIS — D219 Benign neoplasm of connective and other soft tissue, unspecified: Secondary | ICD-10-CM | POA: Diagnosis not present

## 2019-07-10 DIAGNOSIS — Z1231 Encounter for screening mammogram for malignant neoplasm of breast: Secondary | ICD-10-CM | POA: Diagnosis not present

## 2019-07-10 NOTE — Progress Notes (Signed)
HPI:      [name] is a 49 y.o. No obstetric history on file. who LMP was Patient's last menstrual period was 06/22/2019.  Subjective:   She presents today for her annual examination.  She has no complaints.  She continues to have regular normal menses.  She says they are not heavy at this time.  She is using condoms for birth control and has no desire to change. Of significant note she is known to have large uterine fibroids.  At one time she was having very heavy menstrual bleeding requiring transfusion.  This seems to have spontaneously resolved on its own where she has regular amounts of bleeding with her menses.    Hx: The following portions of the patient's history were reviewed and updated as appropriate:             She  has a past medical history of Dysmenorrhea. She does not have any pertinent problems on file. She  has a past surgical history that includes Dilation and curettage of uterus. Her family history includes CVA in her father; Liver cancer in her mother. She  reports that she has never smoked. She has never used smokeless tobacco. She reports that she does not drink alcohol or use drugs. She has a current medication list which includes the following prescription(s): ferrous sulfate. She has No Known Allergies.       Review of Systems:  Review of Systems  Constitutional: Denied constitutional symptoms, night sweats, recent illness, fatigue, fever, insomnia and weight loss.  Eyes: Denied eye symptoms, eye pain, photophobia, vision change and visual disturbance.  Ears/Nose/Throat/Neck: Denied ear, nose, throat or neck symptoms, hearing loss, nasal discharge, sinus congestion and sore throat.  Cardiovascular: Denied cardiovascular symptoms, arrhythmia, chest pain/pressure, edema, exercise intolerance, orthopnea and palpitations.  Respiratory: Denied pulmonary symptoms, asthma, pleuritic pain, productive sputum, cough, dyspnea and wheezing.  Gastrointestinal:  Denied, gastro-esophageal reflux, melena, nausea and vomiting.  Genitourinary: Denied genitourinary symptoms including symptomatic vaginal discharge, pelvic relaxation issues, and urinary complaints.  Musculoskeletal: Denied musculoskeletal symptoms, stiffness, swelling, muscle weakness and myalgia.  Dermatologic: Denied dermatology symptoms, rash and scar.  Neurologic: Denied neurology symptoms, dizziness, headache, neck pain and syncope.  Psychiatric: Denied psychiatric symptoms, anxiety and depression.  Endocrine: Denied endocrine symptoms including hot flashes and night sweats.   Meds:   Current Outpatient Medications on File Prior to Visit  Medication Sig Dispense Refill  . ferrous sulfate 325 (65 FE) MG tablet Take 1 tablet (325 mg total) by mouth 2 (two) times daily with a meal. 60 tablet 3   No current facility-administered medications on file prior to visit.    Objective:     Vitals:   07/10/19 0857  BP: 122/77  Pulse: 96              Physical examination General NAD, Conversant  HEENT Atraumatic; Op clear with mmm.  Normo-cephalic. Pupils reactive. Anicteric sclerae  Thyroid/Neck Smooth without nodularity or enlargement. Normal ROM.  Neck Supple.  Skin No rashes, lesions or ulceration. Normal palpated skin turgor. No nodularity.  Breasts: No masses or discharge.  Symmetric.  No axillary adenopathy.  Lungs: Clear to auscultation.No rales or wheezes. Normal Respiratory effort, no retractions.  Heart: NSR.  No murmurs or rubs appreciated. No periferal edema  Abdomen: Soft.  Non-tender.  Uterus palpable 5 cm above pelvic brim no HSM. No hernia  Extremities: Moves all appropriately.  Normal ROM for age. No lymphadenopathy.  Neuro: Oriented to PPT.  Normal mood. Normal affect.     Pelvic:   Vulva: Normal appearance.  No lesions.  Vagina: No lesions or abnormalities noted.  Support: Normal pelvic support.  Urethra No masses tenderness or scarring.  Meatus Normal size  without lesions or prolapse.  Cervix: Normal appearance.  No lesions.  Anus: Normal exam.  No lesions.  Perineum: Normal exam.  No lesions.        Bimanual   Uterus:  15 weeks size non-tende irregular.  Mobile.  AV.  Adnexae: No masses.  Non-tender to palpation.  Cul-de-sac: Negative for abnormality.      Assessment:    No obstetric history on file. Patient Active Problem List   Diagnosis Date Noted  . Anemia 08/18/2015     1. Well woman exam with routine gynecological exam   2. Encounter for screening mammogram for malignant neoplasm of breast   3. Fibroids     Patient has large uterine fibroids but her bleeding seems to be normal and regular with menses.  She has a history of anemia but this has not been a problem recently.   Plan:            1.  Basic Screening Recommendations The basic screening recommendations for asymptomatic women were discussed with the patient during her visit.  The age-appropriate recommendations were discussed with her and the rational for the tests reviewed.  When I am informed by the patient that another primary care physician has previously obtained the age-appropriate tests and they are up-to-date, only outstanding tests are ordered and referrals given as necessary.  Abnormal results of tests will be discussed with her when all of her results are completed.  Routine preventative health maintenance measures emphasized: Exercise/Diet/Weight control, Tobacco Warnings, Alcohol/Substance use risks and Stress Management Mammogram ordered-blood work today-Pap next year. 2.  We have discussed fibroids in some detail.  Nothing further to do as long as she has normal regular menses and no sign of anemia.  She is to contact us if her bleeding becomes very heavy.  The expectation is she will reach menopause in the next year or two and her fibroids will shrink and her bleeding issues will be completely resolved. Orders Orders Placed This Encounter  Procedures   . MM 3D SCREEN BREAST BILATERAL  . Hemoglobin A1c  . Lipid panel  . TSH    No orders of the defined types were placed in this encounter.       F/U  Return in about 1 year (around 07/09/2020) for Annual Physical.  Finis Bud, M.D. 07/10/2019 9:28 AM

## 2019-07-11 LAB — LIPID PANEL
Chol/HDL Ratio: 2.8 ratio (ref 0.0–4.4)
Cholesterol, Total: 163 mg/dL (ref 100–199)
HDL: 59 mg/dL (ref >39)
LDL Chol Calc (NIH): 93 mg/dL (ref 0–99)
Triglycerides: 55 mg/dL (ref 0–149)
VLDL Cholesterol Cal: 11 mg/dL (ref 5–40)

## 2019-07-11 LAB — HEMOGLOBIN A1C
Est. average glucose Bld gHb Est-mCnc: 114 mg/dL
Hgb A1c MFr Bld: 5.6 % (ref 4.8–5.6)

## 2019-07-11 LAB — TSH: TSH: 1.83 u[IU]/mL (ref 0.450–4.500)

## 2019-12-21 ENCOUNTER — Encounter: Payer: Self-pay | Admitting: Emergency Medicine

## 2019-12-21 ENCOUNTER — Other Ambulatory Visit: Payer: Self-pay

## 2019-12-21 ENCOUNTER — Emergency Department
Admission: EM | Admit: 2019-12-21 | Discharge: 2019-12-21 | Disposition: A | Discharge status: Home / Self Care | Payer: 59 | Attending: Emergency Medicine | Admitting: Emergency Medicine

## 2019-12-21 DIAGNOSIS — R42 Dizziness and giddiness: Secondary | ICD-10-CM | POA: Insufficient documentation

## 2019-12-21 DIAGNOSIS — R55 Syncope and collapse: Secondary | ICD-10-CM | POA: Insufficient documentation

## 2019-12-21 HISTORY — DX: Anemia, unspecified: D64.9

## 2019-12-21 LAB — CBC
HCT: 29.7 % — ABNORMAL LOW (ref 36.0–46.0)
Hemoglobin: 8.8 g/dL — ABNORMAL LOW (ref 12.0–15.0)
MCH: 20.2 pg — ABNORMAL LOW (ref 26.0–34.0)
MCHC: 29.6 g/dL — ABNORMAL LOW (ref 30.0–36.0)
MCV: 68.3 fL — ABNORMAL LOW (ref 80.0–100.0)
Platelets: 240 10*3/uL (ref 150–400)
RBC: 4.35 MIL/uL (ref 3.87–5.11)
RDW: 20.1 % — ABNORMAL HIGH (ref 11.5–15.5)
WBC: 9.1 10*3/uL (ref 4.0–10.5)
nRBC: 0 % (ref 0.0–0.2)

## 2019-12-21 LAB — BASIC METABOLIC PANEL
Anion gap: 8 (ref 5–15)
BUN: 8 mg/dL (ref 6–20)
CO2: 28 mmol/L (ref 22–32)
Calcium: 9 mg/dL (ref 8.9–10.3)
Chloride: 100 mmol/L (ref 98–111)
Creatinine, Ser: 0.82 mg/dL (ref 0.44–1.00)
GFR calc Af Amer: >60 mL/min (ref >60)
GFR calc non Af Amer: >60 mL/min (ref >60)
Glucose, Bld: 128 mg/dL — ABNORMAL HIGH (ref 70–99)
Potassium: 2.8 mmol/L — ABNORMAL LOW (ref 3.5–5.1)
Sodium: 136 mmol/L (ref 135–145)

## 2019-12-21 NOTE — ED Provider Notes (Signed)
[facility] Emergency Department Provider Note   ____________________________________________    I have reviewed the triage vital signs and the nursing notes.   HISTORY  Chief Complaint Loss of Consciousness     HPI Krista Fernandez is a 49 y.o. female who presents after a syncopal episode this morning.  Patient reports she has a long history of anemia, related to iron deficiency.  She also reports fibroid uterus and has had vaginal bleeding recently.  This morning she felt lightheaded and slid down onto the floor.  She denies any palpitations or chest pain or shortness of breath.  Currently feels quite well and has no complaints.  She is concerned that she may need blood transfusion which she has required  back in 2017.  Medical records reviewed which demonstrates she presented with a hemoglobin of 3.4 in 2017.  She was transfused at that time.  She reports her baseline hemoglobin is between 8 and 10.  Past Medical History:  Diagnosis Date  . Anemia   . Dysmenorrhea     Patient Active Problem List   Diagnosis Date Noted  . Anemia 08/18/2015    Past Surgical History:  Procedure Laterality Date  . DILATION AND CURETTAGE OF UTERUS      Prior to Admission medications   Medication Sig Start Date End Date Taking? Authorizing Provider  ferrous sulfate 325 (65 FE) MG tablet Take 1 tablet (325 mg total) by mouth 2 (two) times daily with a meal. 08/19/15   Fritzi Mandes, MD     Allergies Patient has no known allergies.  Family History  Problem Relation Age of Onset  . Liver cancer Mother   . CVA Father     Social History Social History   Tobacco Use  . Smoking status: Never Smoker  . Smokeless tobacco: Never Used  Substance Use Topics  . Alcohol use: No    Alcohol/week: 0.0 standard drinks  . Drug use: No    Review of Systems  Constitutional: No fever/chills Eyes: No visual changes.  ENT: No sore throat. Cardiovascular: Denies chest  pain.  No palpitations Respiratory: Denies shortness of breath. Gastrointestinal: No abdominal pain.  No nausea, no vomiting.   Genitourinary: Vaginal bleeding Musculoskeletal: Negative for back pain. Skin: Negative for rash. Neurological: Negative for headaches   ____________________________________________   PHYSICAL EXAM:  VITAL SIGNS: ED Triage Vitals  Enc Vitals Group     BP 12/21/19 1016 128/79     Pulse Rate 12/21/19 1016 100     Resp 12/21/19 1016 20     Temp 12/21/19 1016 98.7 F (37.1 C)     Temp Source 12/21/19 1016 Oral     SpO2 12/21/19 1016 100 %     Weight 12/21/19 1017 61.7 kg (136 lb)     Height 12/21/19 1017 1.676 m (5\' 6" )     Head Circumference --      Peak Flow --      Pain Score 12/21/19 1017 0     Pain Loc --      Pain Edu? --      Excl. in [place]? --     Constitutional: Alert and oriented.   Nose: No congestion/rhinnorhea. Mouth/Throat: Mucous membranes are moist.    Cardiovascular: Normal rate, regular rhythm. Grossly normal heart sounds.  Good peripheral circulation. Respiratory: Normal respiratory effort.  No retractions. Lungs CTAB. Gastrointestinal: Soft and nontender. No distention.  No CVA tenderness.  Musculoskeletal:   Warm and well perfused  Neurologic:  Normal speech and language. No gross focal neurologic deficits are appreciated.  Skin:  Skin is warm, dry and intact. No rash noted. Psychiatric: Mood and affect are normal. Speech and behavior are normal.  ____________________________________________   LABS (all labs ordered are listed, but only abnormal results are displayed)  Labs Reviewed  BASIC METABOLIC PANEL - Abnormal; Notable for the following components:      Result Value   Potassium 2.8 (*)    Glucose, Bld 128 (*)    All other components within normal limits  CBC - Abnormal; Notable for the following components:   Hemoglobin 8.8 (*)    HCT 29.7 (*)    MCV 68.3 (*)    MCH 20.2 (*)    MCHC 29.6 (*)    RDW 20.1 (*)     All other components within normal limits   ____________________________________________  EKG ED ECG REPORT I, Lavonia Drafts, the attending physician, personally viewed and interpreted this ECG.  Date: 12/21/2019  Rhythm: normal sinus rhythm QRS Axis: normal Intervals: normal ST/T Wave abnormalities: normal Narrative Interpretation: no evidence of acute ischemia  ____________________________________________  RADIOLOGY  None ____________________________________________   PROCEDURES  Procedure(s) performed: No  Procedures   Critical Care performed: No ____________________________________________   INITIAL IMPRESSION / ASSESSMENT AND PLAN / ED COURSE  Pertinent labs & imaging results that were available during my care of the patient were reviewed by me and considered in my medical decision making (see chart for details).  Patient presents after syncopal episode this morning.  Currently feeling well.  Vital signs are reassuring.  Exam is unremarkable.  Differential includes anemia, vasovagal syncope, dehydration  Patient does report she did not eat much of anything yesterday because of menstrual cramping.  This is likely the cause of her mild hypokalemia.  She is reassured to hear her hemoglobin is 8.8 which is normal for her.  Given reassuring work-up and normal exam patient appropriate for discharge at this time, outpatient follow-up with PCP.     ____________________________________________   FINAL CLINICAL IMPRESSION(S) / ED DIAGNOSES  Final diagnoses:  Syncope and collapse        Note:  This document was prepared using Dragon voice recognition software and may include unintentional dictation errors.   Lavonia Drafts, MD 12/21/19 [phone]

## 2019-12-21 NOTE — ED Notes (Signed)
AAOx3.  Skin warm and dry.  NAD 

## 2019-12-21 NOTE — ED Triage Notes (Signed)
Pt reports blacked out this am and slid down and thinks she may be anemic. Pt reports similar episode years ago and that was the cause.

## 2020-03-25 ENCOUNTER — Ambulatory Visit (INDEPENDENT_AMBULATORY_CARE_PROVIDER_SITE_OTHER): Payer: 59 | Admitting: Obstetrics and Gynecology

## 2020-03-25 ENCOUNTER — Other Ambulatory Visit: Payer: Self-pay

## 2020-03-25 ENCOUNTER — Encounter: Payer: Self-pay | Admitting: Obstetrics and Gynecology

## 2020-03-25 VITALS — BP 119/77 | HR 89 | Ht 66.0 in | Wt 136.1 lb

## 2020-03-25 DIAGNOSIS — D649 Anemia, unspecified: Secondary | ICD-10-CM

## 2020-03-25 DIAGNOSIS — N938 Other specified abnormal uterine and vaginal bleeding: Secondary | ICD-10-CM | POA: Diagnosis not present

## 2020-03-25 DIAGNOSIS — D219 Benign neoplasm of connective and other soft tissue, unspecified: Secondary | ICD-10-CM | POA: Diagnosis not present

## 2020-03-25 MED ORDER — NORETHINDRONE ACETATE 5 MG PO TABS: 5.0000 mg | ORAL_TABLET | Freq: Two times a day (BID) | ORAL | 1 refills | Status: DC

## 2020-03-25 MED ORDER — NORETHINDRONE ACETATE 5 MG PO TABS: 5.0000 mg | ORAL_TABLET | ORAL | 1 refills | Status: AC

## 2020-03-25 NOTE — Progress Notes (Signed)
HPI:      [name] is a 49 y.o. No obstetric history on file. who LMP was No LMP recorded.  Subjective:   She presents today for follow-up of emergency department visit.  Patient has known uterine fibroids and has had heavy bleeding monthly.  Recently over the last 2 months she has bled for 21 days at a time.  Her recent emergency department visit showed a decreased hemoglobin (8.8). Of significant note patient has uterine fibroids last ultrasound in 2017.  She has never tried anything to manage her periods.  Still having monthly menses.    Hx: The following portions of the patient's history were reviewed and updated as appropriate:             She  has a past medical history of Anemia and Dysmenorrhea. She does not have any pertinent problems on file. She  has a past surgical history that includes Dilation and curettage of uterus. Her family history includes CVA in her father; Liver cancer in her mother. She  reports that she has never smoked. She has never used smokeless tobacco. She reports that she does not drink alcohol and does not use drugs. She has a current medication list which includes the following prescription(s): ferrous sulfate, medroxyprogesterone, and norethindrone. She has No Known Allergies.       Review of Systems:  Review of Systems  Constitutional: Denied constitutional symptoms, night sweats, recent illness, fatigue, fever, insomnia and weight loss.  Eyes: Denied eye symptoms, eye pain, photophobia, vision change and visual disturbance.  Ears/Nose/Throat/Neck: Denied ear, nose, throat or neck symptoms, hearing loss, nasal discharge, sinus congestion and sore throat.  Cardiovascular: Denied cardiovascular symptoms, arrhythmia, chest pain/pressure, edema, exercise intolerance, orthopnea and palpitations.  Respiratory: Denied pulmonary symptoms, asthma, pleuritic pain, productive sputum, cough, dyspnea and wheezing.  Gastrointestinal: Denied,  gastro-esophageal reflux, melena, nausea and vomiting.  Genitourinary: See HPI for additional information.  Musculoskeletal: Denied musculoskeletal symptoms, stiffness, swelling, muscle weakness and myalgia.  Dermatologic: Denied dermatology symptoms, rash and scar.  Neurologic: Denied neurology symptoms, dizziness, headache, neck pain and syncope.  Psychiatric: Denied psychiatric symptoms, anxiety and depression.  Endocrine: Denied endocrine symptoms including hot flashes and night sweats.   Meds:   Current Outpatient Medications on File Prior to Visit  Medication Sig Dispense Refill  . ferrous sulfate 325 (65 FE) MG tablet Take 1 tablet (325 mg total) by mouth 2 (two) times daily with a meal. 60 tablet 3  . medroxyPROGESTERone (PROVERA) 10 MG tablet Take 10 mg by mouth daily.     No current facility-administered medications on file prior to visit.          Objective:     Vitals:   03/25/20 1045  BP: 119/77  Pulse: 89   Filed Weights   03/25/20 1045  Weight: 136 lb 1.6 oz (61.7 kg)              ED visit and previous ultrasound reviewed in detail  Assessment:    No obstetric history on file. Patient Active Problem List   Diagnosis Date Noted  . Anemia 08/18/2015     1. Fibroids   2. DUB (dysfunctional uterine bleeding)   3. Anemia, unspecified type     Patient likely anemic and having dysfunctional uterine bleeding secondary to uterine fibroids.  Has never tried any type of hormonal management.  Although patient is of menopausal age she continues to experience monthly menses when she is not having DU B.  Last ultrasound revealed the possibility of a submucosal component of uterine fibroid.   Plan:            1.  Ultrasound -check uterine fibroids, size, location, and endometrial thickness  2.  Attempt to control bleeding using Aygestin.  I discussed with her the possibility of fibroid embolization, endometrial ablation, hormonal control using pills or IUD  and finally the possibility of hysterectomy.  The risk benefits of each procedure was discussed in detail and all of her questions were answered.  Because she is age wise close to menopause if we can moderately control her bleeding she will likely enter menopause in the near future and her fibroids will no longer be an issue.  We will attempt to stop her bleeding with Aygestin followed by daily progesterone which can continue until menopause if her bleeding is controlled.  If not controlled would consider IUD or possibly fibroid embolization.   Orders Orders Placed This Encounter  Procedures  . US PELVIS (TRANSABDOMINAL ONLY)  . US PELVIS TRANSVAGINAL NON-OB (TV ONLY)     Meds ordered this encounter  Medications  . DISCONTD: norethindrone (AYGESTIN) 5 MG tablet    Sig: Take 1 tablet (5 mg total) by mouth 2 (two) times daily. Take 1 tablet 3 times daily for 4 days.  Then 2 tablets 7 days.  Then 1 tablet daily.    Dispense:  100 tablet    Refill:  1  . norethindrone (AYGESTIN) 5 MG tablet    Sig: Take 1 tablet (5 mg total) by mouth as directed. Take 1 tablet 3 times daily for 4 days.  Then 2 tablets daily for 7 days.  Then 1 tablet daily    Dispense:  100 tablet    Refill:  1      F/U  Return for We will contact her with any abnormal test results. I spent 34 minutes involved in the care of this patient preparing to see the patient by obtaining and reviewing her medical history (including labs, imaging tests and prior procedures), documenting clinical information in the electronic health record (EHR), counseling and coordinating care plans, writing and sending prescriptions, ordering tests or procedures and directly communicating with the patient by discussing pertinent items from her history and physical exam as well as detailing my assessment and plan as noted above so that she has an informed understanding.  All of her questions were answered.  Finis Bud, M.D. 03/25/2020 11:27  AM

## 2020-04-01 ENCOUNTER — Encounter: Payer: 59 | Admitting: Obstetrics and Gynecology

## 2020-04-08 ENCOUNTER — Ambulatory Visit (INDEPENDENT_AMBULATORY_CARE_PROVIDER_SITE_OTHER): Payer: 59

## 2020-04-08 ENCOUNTER — Encounter: Payer: 59 | Admitting: Obstetrics and Gynecology

## 2020-04-08 ENCOUNTER — Other Ambulatory Visit: Payer: Self-pay

## 2020-04-08 DIAGNOSIS — N938 Other specified abnormal uterine and vaginal bleeding: Secondary | ICD-10-CM

## 2020-04-08 DIAGNOSIS — D219 Benign neoplasm of connective and other soft tissue, unspecified: Secondary | ICD-10-CM

## 2020-04-29 ENCOUNTER — Ambulatory Visit (INDEPENDENT_AMBULATORY_CARE_PROVIDER_SITE_OTHER): Payer: 59 | Admitting: Obstetrics and Gynecology

## 2020-04-29 ENCOUNTER — Other Ambulatory Visit: Payer: Self-pay

## 2020-04-29 ENCOUNTER — Encounter: Payer: Self-pay | Admitting: Obstetrics and Gynecology

## 2020-04-29 VITALS — BP 126/74 | HR 97 | Ht 66.0 in | Wt 136.1 lb

## 2020-04-29 DIAGNOSIS — N938 Other specified abnormal uterine and vaginal bleeding: Secondary | ICD-10-CM | POA: Diagnosis not present

## 2020-04-29 DIAGNOSIS — D219 Benign neoplasm of connective and other soft tissue, unspecified: Secondary | ICD-10-CM | POA: Diagnosis not present

## 2020-04-29 NOTE — Progress Notes (Signed)
HPI:      [name] is a 49 y.o. No obstetric history on file. who LMP was Patient's last menstrual period was 04/08/2020.  Subjective:   She presents today to discuss her irregular bleeding uterine fibroids and pelvic pain.  She has been taking the Aygestin and yet had a period hat was a little better than usual but still was significant for bleeding.  She has previously had menses that dropped her hemoglobin. An ultrasound performed shows no submucosal fibroids but patient has multiple intramural and subserosal fibroids. She is not interested in hysterectomy.  She is most interested in fibroid embolization.    Hx: The following portions of the patient's history were reviewed and updated as appropriate:             She  has a past medical history of Anemia and Dysmenorrhea. She does not have any pertinent problems on file. She  has a past surgical history that includes Dilation and curettage of uterus. Her family history includes CVA in her father; Liver cancer in her mother. She  reports that she has never smoked. She has never used smokeless tobacco. She reports that she does not drink alcohol and does not use drugs. She has a current medication list which includes the following prescription(s): ferrous sulfate and norethindrone. She has No Known Allergies.       Review of Systems:  Review of Systems  Constitutional: Denied constitutional symptoms, night sweats, recent illness, fatigue, fever, insomnia and weight loss.  Eyes: Denied eye symptoms, eye pain, photophobia, vision change and visual disturbance.  Ears/Nose/Throat/Neck: Denied ear, nose, throat or neck symptoms, hearing loss, nasal discharge, sinus congestion and sore throat.  Cardiovascular: Denied cardiovascular symptoms, arrhythmia, chest pain/pressure, edema, exercise intolerance, orthopnea and palpitations.  Respiratory: Denied pulmonary symptoms, asthma, pleuritic pain, productive sputum, cough, dyspnea and  wheezing.  Gastrointestinal: Denied, gastro-esophageal reflux, melena, nausea and vomiting.  Genitourinary: See HPI for additional information.  Musculoskeletal: Denied musculoskeletal symptoms, stiffness, swelling, muscle weakness and myalgia.  Dermatologic: Denied dermatology symptoms, rash and scar.  Neurologic: Denied neurology symptoms, dizziness, headache, neck pain and syncope.  Psychiatric: Denied psychiatric symptoms, anxiety and depression.  Endocrine: Denied endocrine symptoms including hot flashes and night sweats.   Meds:   Current Outpatient Medications on File Prior to Visit  Medication Sig Dispense Refill  . ferrous sulfate 325 (65 FE) MG tablet Take 1 tablet (325 mg total) by mouth 2 (two) times daily with a meal. 60 tablet 3  . norethindrone (AYGESTIN) 5 MG tablet Take 1 tablet (5 mg total) by mouth as directed. Take 1 tablet 3 times daily for 4 days.  Then 2 tablets daily for 7 days.  Then 1 tablet daily 100 tablet 1   No current facility-administered medications on file prior to visit.          Objective:     Vitals:   04/29/20 0903  BP: 126/74  Pulse: 97   Filed Weights   04/29/20 0903  Weight: 136 lb 1.6 oz (61.7 kg)              Ultrasound results reviewed directly with the patient size and location of fibroids discussed.  Assessment:    No obstetric history on file. Patient Active Problem List   Diagnosis Date Noted  . Anemia 08/18/2015     1. DUB (dysfunctional uterine bleeding)   2. Fibroids     Patient with multiple uterine fibroids causing dysfunctional bleeding, drop in  hemoglobin, pelvic pain.     Plan:            1.  Have discussed multiple options with the patient for management.  These include hysterectomy, endometrial ablation, hormonal control including possible IUD, and fibroid embolization.  Patient is most interested in embolization.  Will refer to vascular for evaluation of possible embolization.  Orders Orders Placed  This Encounter  Procedures  . Ambulatory referral to Vascular Surgery    No orders of the defined types were placed in this encounter.     F/U  Return for Pt to contact us if symptoms worsen. I spent 22 minutes involved in the care of this patient preparing to see the patient by obtaining and reviewing her medical history (including labs, imaging tests and prior procedures), documenting clinical information in the electronic health record (EHR), counseling and coordinating care plans, writing and sending prescriptions, ordering tests or procedures and directly communicating with the patient by discussing pertinent items from her history and physical exam as well as detailing my assessment and plan as noted above so that she has an informed understanding.  All of her questions were answered.  Finis Bud, M.D. 04/29/2020 9:42 AM

## 2020-07-06 ENCOUNTER — Ambulatory Visit (INDEPENDENT_AMBULATORY_CARE_PROVIDER_SITE_OTHER): Payer: 59 | Admitting: Vascular Surgery

## 2020-07-06 ENCOUNTER — Other Ambulatory Visit: Payer: Self-pay

## 2020-07-06 VITALS — BP 147/78 | HR 78 | Ht 66.0 in | Wt 140.0 lb

## 2020-07-06 DIAGNOSIS — D649 Anemia, unspecified: Secondary | ICD-10-CM | POA: Diagnosis not present

## 2020-07-06 DIAGNOSIS — D259 Leiomyoma of uterus, unspecified: Secondary | ICD-10-CM | POA: Diagnosis not present

## 2020-07-06 NOTE — Progress Notes (Signed)
Patient ID: Krista Fernandez, female   DOB: 1970-09-07, 50 y.o.   MRN: [id number]  Chief Complaint  Patient presents with  . New Patient (Initial Visit)    Krista Fernandez. Fibroids. DUB    HPI Krista Fernandez is a 50 y.o. female.  I am asked to see the patient by Dr. Amalia Hailey for evaluation of pain and dysfunctional uterine bleeding from multiple uterine fibroids. This has been going on for about 4 to 5 years now. It has gradually progressed and now she has very heavy menstrual cycles as well as irregular bleeding and pain which has gotten worse. She is reasonably healthy otherwise although she has now developed anemia from her dysfunctional uterine bleeding. An ultrasound was done which I have reviewed which demonstrates multiple uterine fibroids present. Given this finding, she is referred for further evaluation and treatment and consideration for uterine artery embolization.     Past Medical History:  Diagnosis Date  . Anemia   . Dysmenorrhea     Past Surgical History:  Procedure Laterality Date  . DILATION AND CURETTAGE OF UTERUS       Family History  Problem Relation Age of Onset  . Liver cancer Mother   . CVA Father   No bleeding or clotting disorders   Social History   Tobacco Use  . Smoking status: Never Smoker  . Smokeless tobacco: Never Used  Substance Use Topics  . Alcohol use: No    Alcohol/week: 0.0 standard drinks  . Drug use: No    No Known Allergies  Current Outpatient Medications  Medication Sig Dispense Refill  . ferrous sulfate 325 (65 FE) MG tablet Take 1 tablet (325 mg total) by mouth 2 (two) times daily with a meal. 60 tablet 3  . norethindrone (AYGESTIN) 5 MG tablet Take 1 tablet (5 mg total) by mouth as directed. Take 1 tablet 3 times daily for 4 days.  Then 2 tablets daily for 7 days.  Then 1 tablet daily 100 tablet 1   No current facility-administered medications for this visit.      REVIEW OF SYSTEMS (Negative unless  checked)  Constitutional: [] Weight loss  [] Fever  [] Chills Cardiac: [] Chest pain   [] Chest pressure   [] Palpitations   [] Shortness of breath when laying flat   [] Shortness of breath at rest   [] Shortness of breath with exertion. Vascular:  [] Pain in legs with walking   [] Pain in legs at rest   [] Pain in legs when laying flat   [] Claudication   [] Pain in feet when walking  [] Pain in feet at rest  [] Pain in feet when laying flat   [] History of DVT   [] Phlebitis   [] Swelling in legs   [] Varicose veins   [] Non-healing ulcers Pulmonary:   [] Uses home oxygen   [] Productive cough   [] Hemoptysis   [] Wheeze  [] COPD   [] Asthma Neurologic:  [] Dizziness  [] Blackouts   [] Seizures   [] History of stroke   [] History of TIA  [] Aphasia   [] Temporary blindness   [] Dysphagia   [] Weakness or numbness in arms   [] Weakness or numbness in legs Musculoskeletal:  [] Arthritis   [] Joint swelling   [] Joint pain   [] Low back pain Hematologic:  [] Easy bruising  [] Easy bleeding   [] Hypercoagulable state   [x] Anemic  [] Hepatitis Gastrointestinal:  [] Blood in stool   [] Vomiting blood  [] Gastroesophageal reflux/heartburn   [x] Abdominal pain Genitourinary:  [] Chronic kidney disease   [] Difficult urination  [] Frequent urination  [] Burning with urination   []   Hematuria Skin:  [] Rashes   [] Ulcers   [] Wounds Psychological:  [] History of anxiety   []  History of major depression.    Physical Exam BP (!) 147/78   Pulse 78   Ht 5\' 6"  (1.676 m)   Wt 140 lb (63.5 kg)   BMI 22.60 kg/m  Gen:  WD/WN, NAD. Appears younger than stated age Head: [place]/AT, No temporalis wasting Ear/Nose/Throat: Hearing grossly intact, nares w/o erythema or drainage, oropharynx w/o Erythema/Exudate Eyes: Conjunctiva clear, sclera non-icteric  Neck: trachea midline.  No JVD.  Pulmonary:  Good air movement, respirations not labored, no use of accessory muscles  Cardiac: RRR, no JVD Vascular:  Vessel Right Left  Radial Palpable Palpable                                    Gastrointestinal:. No masses, surgical incisions, or scars. Musculoskeletal: M/S 5/5 throughout.  Extremities without ischemic changes.  No deformity or atrophy. No edema. Neurologic: Sensation grossly intact in extremities.  Symmetrical.  Speech is fluent. Motor exam as listed above. Psychiatric: Judgment intact, Mood & affect appropriate for pt's clinical situation. Dermatologic: No rashes or ulcers noted.  No cellulitis or open wounds.    Radiology No results found.  Labs No results found for this or any previous visit (from the past 2160 hour(s)).  Assessment/Plan:  Anemia Likely worsened or primarily caused by dysfunctional uterine bleeding.  Uterine fibroid The patient has multiple uterine fibroids which are causing worsening pain and dysfunctional uterine bleeding. This has led to anemia as a complication from her excessive bleeding. She is interested in a nonsurgical option for her uterine fibroids that she is referred for uterine artery embolization. We had a long discussion today regarding the procedure in detail. We discussed the risks and benefits. We discussed this being done with moderate sedation with either an outpatient or an overnight hospital stay. We discussed the expected postoperative pain and bleeding. The patient voices her understanding and desires to proceed with uterine artery embolization.      Leotis Pain 07/06/2020, 11:39 AM   This note was created with Dragon medical transcription system.  Any errors from dictation are unintentional.

## 2020-07-06 NOTE — Assessment & Plan Note (Signed)
Likely worsened or primarily caused by dysfunctional uterine bleeding.

## 2020-07-06 NOTE — Assessment & Plan Note (Signed)
The patient has multiple uterine fibroids which are causing worsening pain and dysfunctional uterine bleeding. This has led to anemia as a complication from her excessive bleeding. She is interested in a nonsurgical option for her uterine fibroids that she is referred for uterine artery embolization. We had a long discussion today regarding the procedure in detail. We discussed the risks and benefits. We discussed this being done with moderate sedation with either an outpatient or an overnight hospital stay. We discussed the expected postoperative pain and bleeding. The patient voices her understanding and desires to proceed with uterine artery embolization.

## 2020-07-06 NOTE — Patient Instructions (Signed)
Uterine Artery Embolization for Fibroids  Uterine artery embolization is a procedure to shrink uterine fibroids. Uterine fibroids are masses of tissue (tumors) that can develop in the womb (uterus). They are also called leiomyomas. This type of tumor is not cancerous (benign) and does not spread to other parts of the body outside of the pelvic area. The pelvic area is the part of the body between the hip bones. You can have one or many fibroids. Fibroids can vary in size, shape, weight, and where they grow in the uterus. Some can become quite large. In this procedure, a thin plastic tube (catheter) is used to inject a chemical that blocks off the blood supply to the fibroid, which causes the fibroid to shrink. Tell a health care provider about:  Any allergies you have.  All medicines you are taking, including vitamins, herbs, eye drops, creams, and over-the-counter medicines.  Any problems you or family members have had with anesthetic medicines.  Any blood disorders you have.  Any surgeries you have had.  Any medical conditions you have.  Whether you are pregnant or may be pregnant. What are the risks? Generally, this is a safe procedure. However, problems may occur, including:  Bleeding.  Allergic reactions to medicines or dyes.  Damage to other structures or organs.  Infection, including blood infection (septicemia).  Injury to the uterus from decreased blood supply.  Lack of menstrual periods (amenorrhea).  Death of tissue cells (necrosis) around your bladder or vulva.  Development of a hole between organs or from an organ to the surface of your skin (fistula).  Blood clot in the legs (deep vein thrombosis) or lung (pulmonary embolus).  Nausea and vomiting. What happens before the procedure? Staying hydrated Follow instructions from your health care provider about hydration, which may include:  Up to 2 hours before the procedure - you may continue to drink clear  liquids, such as water, clear fruit juice, black coffee, and plain tea. Eating and drinking restrictions Follow instructions from your health care provider about eating and drinking, which may include:  8 hours before the procedure - stop eating heavy meals or foods such as meat, fried foods, or fatty foods.  6 hours before the procedure - stop eating light meals or foods, such as toast or cereal.  6 hours before the procedure - stop drinking milk or drinks that contain milk.  2 hours before the procedure - stop drinking clear liquids. Medicines  Ask your health care provider about: ? Changing or stopping your regular medicines. This is especially important if you are taking diabetes medicines or blood thinners. ? Taking over-the-counter medicines, vitamins, herbs, and supplements. ? Taking medicines such as aspirin and ibuprofen. These medicines can thin your blood. Do not take these medicines unless your health care provider tells you to take them.  You may be given antibiotic medicine to help prevent infection.  You may be given medicine to prevent nausea and vomiting (antiemetic). General instructions  Ask your health care provider how your surgical site will be marked or identified.  You may be asked to shower with a germ-killing soap.  Plan to have someone take you home from the hospital or clinic.  If you will be going home right after the procedure, plan to have someone with you for 24 hours.  You will be asked to empty your bladder. What happens during the procedure?  To lower your risk of infection: ? Your health care team will wash or sanitize their hands. ?   Hair may be removed from the surgical area. ? Your skin will be washed with soap.  An IV will be inserted into one of your veins.  You will be given one or more of the following: ? A medicine to help you relax (sedative). ? A medicine to numb the area (local anesthetic).  A small cut (incision) will be made  in your groin.  A catheter will be inserted into the main artery of your leg. The catheter will be guided through the artery to your uterus.  A series of images will be taken while dye is injected through the catheter in your groin. X-rays are taken at the same time. This is done to provide a road map of the blood supply to your uterus and fibroids.  Tiny plastic spheres, about the size of sand grains, will be injected through the catheter. Metal coils may be used to help block the artery. The particles will lodge in tiny branches of the uterine artery that supplies blood to the fibroids.  The procedure will be repeated on the artery that supplies the other side of the uterus.  The catheter will be removed and pressure will be applied to stop the bleeding.  A dressing will be placed over the incision. The procedure may vary among health care providers and hospitals. What happens after the procedure?  Your blood pressure, heart rate, breathing rate, and blood oxygen level will be monitored until the medicines you were given have worn off.  You will be given pain medicine as needed.  You may be given medicine for nausea and vomiting as needed.  Do not drive for 24 hours if you were given a sedative. Summary  Uterine artery embolization is a procedure to shrink uterine fibroids by blocking the blood supply to the fibroid.  You may be given a sedative and local anesthetic for the procedure.  A catheter will be inserted into the main artery of your leg. The catheter will be guided through the artery to your uterus.  After the procedure you will be given pain medicine and medicine for nausea as needed.  Do not drive for 24 hours if you were given a sedative. This information is not intended to replace advice given to you by your health care provider. Make sure you discuss any questions you have with your health care provider. Document Revised: 04/27/2017 Document Reviewed:  08/17/2016 Elsevier Patient Education  2021 [employer].

## 2020-07-06 NOTE — H&P (View-Only) (Signed)
Patient ID: Krista Fernandez, female   DOB: 26-Aug-1970, 50 y.o.   MRN: [id number]  Chief Complaint  Patient presents with  . New Patient (Initial Visit)    [place]. Fibroids. DUB    HPI Krista Fernandez is a 50 y.o. female.  I am asked to see the patient by Dr. Amalia Hailey for evaluation of pain and dysfunctional uterine bleeding from multiple uterine fibroids. This has been going on for about 4 to 5 years now. It has gradually progressed and now she has very heavy menstrual cycles as well as irregular bleeding and pain which has gotten worse. She is reasonably healthy otherwise although she has now developed anemia from her dysfunctional uterine bleeding. An ultrasound was done which I have reviewed which demonstrates multiple uterine fibroids present. Given this finding, she is referred for further evaluation and treatment and consideration for uterine artery embolization.     Past Medical History:  Diagnosis Date  . Anemia   . Dysmenorrhea     Past Surgical History:  Procedure Laterality Date  . DILATION AND CURETTAGE OF UTERUS       Family History  Problem Relation Age of Onset  . Liver cancer Mother   . CVA Father   No bleeding or clotting disorders   Social History   Tobacco Use  . Smoking status: Never Smoker  . Smokeless tobacco: Never Used  Substance Use Topics  . Alcohol use: No    Alcohol/week: 0.0 standard drinks  . Drug use: No    No Known Allergies  Current Outpatient Medications  Medication Sig Dispense Refill  . ferrous sulfate 325 (65 FE) MG tablet Take 1 tablet (325 mg total) by mouth 2 (two) times daily with a meal. 60 tablet 3  . norethindrone (AYGESTIN) 5 MG tablet Take 1 tablet (5 mg total) by mouth as directed. Take 1 tablet 3 times daily for 4 days.  Then 2 tablets daily for 7 days.  Then 1 tablet daily 100 tablet 1   No current facility-administered medications for this visit.      REVIEW OF SYSTEMS (Negative unless  checked)  Constitutional: [] Weight loss  [] Fever  [] Chills Cardiac: [] Chest pain   [] Chest pressure   [] Palpitations   [] Shortness of breath when laying flat   [] Shortness of breath at rest   [] Shortness of breath with exertion. Vascular:  [] Pain in legs with walking   [] Pain in legs at rest   [] Pain in legs when laying flat   [] Claudication   [] Pain in feet when walking  [] Pain in feet at rest  [] Pain in feet when laying flat   [] History of DVT   [] Phlebitis   [] Swelling in legs   [] Varicose veins   [] Non-healing ulcers Pulmonary:   [] Uses home oxygen   [] Productive cough   [] Hemoptysis   [] Wheeze  [] COPD   [] Asthma Neurologic:  [] Dizziness  [] Blackouts   [] Seizures   [] History of stroke   [] History of TIA  [] Aphasia   [] Temporary blindness   [] Dysphagia   [] Weakness or numbness in arms   [] Weakness or numbness in legs Musculoskeletal:  [] Arthritis   [] Joint swelling   [] Joint pain   [] Low back pain Hematologic:  [] Easy bruising  [] Easy bleeding   [] Hypercoagulable state   [x] Anemic  [] Hepatitis Gastrointestinal:  [] Blood in stool   [] Vomiting blood  [] Gastroesophageal reflux/heartburn   [x] Abdominal pain Genitourinary:  [] Chronic kidney disease   [] Difficult urination  [] Frequent urination  [] Burning with urination   []   Hematuria Skin:  [] Rashes   [] Ulcers   [] Wounds Psychological:  [] History of anxiety   []  History of major depression.    Physical Exam BP (!) 147/78   Pulse 78   Ht 5\' 6"  (1.676 m)   Wt 140 lb (63.5 kg)   BMI 22.60 kg/m  Gen:  WD/WN, NAD. Appears younger than stated age Head: [place]/AT, No temporalis wasting Ear/Nose/Throat: Hearing grossly intact, nares w/o erythema or drainage, oropharynx w/o Erythema/Exudate Eyes: Conjunctiva clear, sclera non-icteric  Neck: trachea midline.  No JVD.  Pulmonary:  Good air movement, respirations not labored, no use of accessory muscles  Cardiac: RRR, no JVD Vascular:  Vessel Right Left  Radial Palpable Palpable                                    Gastrointestinal:. No masses, surgical incisions, or scars. Musculoskeletal: M/S 5/5 throughout.  Extremities without ischemic changes.  No deformity or atrophy. No edema. Neurologic: Sensation grossly intact in extremities.  Symmetrical.  Speech is fluent. Motor exam as listed above. Psychiatric: Judgment intact, Mood & affect appropriate for pt's clinical situation. Dermatologic: No rashes or ulcers noted.  No cellulitis or open wounds.    Radiology No results found.  Labs No results found for this or any previous visit (from the past 2160 hour(s)).  Assessment/Plan:  Anemia Likely worsened or primarily caused by dysfunctional uterine bleeding.  Uterine fibroid The patient has multiple uterine fibroids which are causing worsening pain and dysfunctional uterine bleeding. This has led to anemia as a complication from her excessive bleeding. She is interested in a nonsurgical option for her uterine fibroids that she is referred for uterine artery embolization. We had a long discussion today regarding the procedure in detail. We discussed the risks and benefits. We discussed this being done with moderate sedation with either an outpatient or an overnight hospital stay. We discussed the expected postoperative pain and bleeding. The patient voices her understanding and desires to proceed with uterine artery embolization.      Leotis Pain 07/06/2020, 11:39 AM   This note was created with Dragon medical transcription system.  Any errors from dictation are unintentional.

## 2020-07-07 ENCOUNTER — Telehealth (INDEPENDENT_AMBULATORY_CARE_PROVIDER_SITE_OTHER): Payer: Self-pay

## 2020-07-07 NOTE — Telephone Encounter (Signed)
Spoke with the patient and she is scheduled with Dr. Lucky Cowboy for uterine artery embolization on 07/12/20 with a 9:00 am arrival to the [facility]. Covid testing on 07/08/20 between 8-1 pm at the [facility]. Pre-procedure instructions were discussed and mailed.

## 2020-07-08 ENCOUNTER — Other Ambulatory Visit
Admission: RE | Admit: 2020-07-08 | Discharge: 2020-07-08 | Disposition: A | Discharge status: Home / Self Care | Payer: 59 | Source: Ambulatory Visit | Attending: Vascular Surgery | Admitting: Vascular Surgery

## 2020-07-08 ENCOUNTER — Other Ambulatory Visit: Payer: Self-pay

## 2020-07-08 DIAGNOSIS — Z01812 Encounter for preprocedural laboratory examination: Secondary | ICD-10-CM | POA: Diagnosis present

## 2020-07-08 DIAGNOSIS — Z20822 Contact with and (suspected) exposure to covid-19: Secondary | ICD-10-CM | POA: Diagnosis not present

## 2020-07-08 LAB — SARS CORONAVIRUS 2 (TAT 6-24 HRS): SARS Coronavirus 2: NEGATIVE

## 2020-07-12 ENCOUNTER — Other Ambulatory Visit (INDEPENDENT_AMBULATORY_CARE_PROVIDER_SITE_OTHER): Payer: Self-pay | Admitting: Nurse Practitioner

## 2020-07-12 ENCOUNTER — Ambulatory Visit
Admission: RE | Admit: 2020-07-12 | Discharge: 2020-07-12 | Disposition: A | Discharge status: Home / Self Care | Payer: 59 | Attending: Vascular Surgery | Admitting: Vascular Surgery

## 2020-07-12 ENCOUNTER — Encounter: Payer: Self-pay | Admitting: Vascular Surgery

## 2020-07-12 ENCOUNTER — Other Ambulatory Visit: Payer: Self-pay

## 2020-07-12 ENCOUNTER — Encounter
Admission: RE | Disposition: A | Discharge status: Home / Self Care | Payer: Self-pay | Source: Home / Self Care | Attending: Vascular Surgery

## 2020-07-12 DIAGNOSIS — D259 Leiomyoma of uterus, unspecified: Secondary | ICD-10-CM | POA: Diagnosis not present

## 2020-07-12 DIAGNOSIS — N938 Other specified abnormal uterine and vaginal bleeding: Secondary | ICD-10-CM | POA: Diagnosis not present

## 2020-07-12 DIAGNOSIS — D649 Anemia, unspecified: Secondary | ICD-10-CM | POA: Insufficient documentation

## 2020-07-12 HISTORY — DX: Leiomyoma of uterus, unspecified: D25.9

## 2020-07-12 HISTORY — PX: EMBOLIZATION: CATH118239

## 2020-07-12 LAB — CREATININE, SERUM
Creatinine, Ser: 0.54 mg/dL (ref 0.44–1.00)
GFR, Estimated: >60 mL/min (ref >60)

## 2020-07-12 LAB — BUN: BUN: 17 mg/dL (ref 6–20)

## 2020-07-12 SURGERY — EMBOLIZATION
Anesthesia: Moderate Sedation

## 2020-07-12 MED ORDER — CEFAZOLIN SODIUM-DEXTROSE 2-4 GM/100ML-% IV SOLN: 2.0000 g | Freq: Once | INTRAVENOUS | Status: DC

## 2020-07-12 MED ORDER — MIDAZOLAM HCL 2 MG/ML PO SYRP: 8.0000 mg | ORAL_SOLUTION | Freq: Once | ORAL | Status: DC | PRN

## 2020-07-12 MED ORDER — FENTANYL CITRATE (PF) 100 MCG/2ML IJ SOLN
INTRAMUSCULAR | Status: DC | PRN
  Administered 2020-07-12: 50 ug via INTRAVENOUS
  Administered 2020-07-12 (×2): 25 ug via INTRAVENOUS

## 2020-07-12 MED ORDER — ONDANSETRON HCL 4 MG/2ML IJ SOLN
INTRAMUSCULAR | Status: AC
  Filled 2020-07-12: qty 2

## 2020-07-12 MED ORDER — MIDAZOLAM HCL 2 MG/2ML IJ SOLN
INTRAMUSCULAR | Status: DC | PRN
  Administered 2020-07-12 (×3): 1 mg via INTRAVENOUS
  Administered 2020-07-12: 2 mg via INTRAVENOUS

## 2020-07-12 MED ORDER — HYDROCODONE-ACETAMINOPHEN 5-325 MG PO TABS
ORAL_TABLET | ORAL | Status: AC
  Filled 2020-07-12: qty 1

## 2020-07-12 MED ORDER — HYDROCODONE-ACETAMINOPHEN 5-325 MG PO TABS: 1.0000 | ORAL_TABLET | Freq: Four times a day (QID) | ORAL | 0 refills | Status: AC | PRN

## 2020-07-12 MED ORDER — SODIUM CHLORIDE 0.9 % IV SOLN
INTRAVENOUS | Status: DC
  Administered 2020-07-12: 1000 mL via INTRAVENOUS

## 2020-07-12 MED ORDER — CEFAZOLIN SODIUM-DEXTROSE 2-4 GM/100ML-% IV SOLN
INTRAVENOUS | Status: AC
  Filled 2020-07-12: qty 100

## 2020-07-12 MED ORDER — METHYLPREDNISOLONE SODIUM SUCC 125 MG IJ SOLR: 125.0000 mg | Freq: Once | INTRAMUSCULAR | Status: DC | PRN

## 2020-07-12 MED ORDER — ONDANSETRON HCL 4 MG/2ML IJ SOLN
4.0000 mg | Freq: Four times a day (QID) | INTRAMUSCULAR | Status: DC | PRN
  Administered 2020-07-12: 4 mg via INTRAVENOUS

## 2020-07-12 MED ORDER — MIDAZOLAM HCL 5 MG/5ML IJ SOLN
INTRAMUSCULAR | Status: AC
  Filled 2020-07-12: qty 5

## 2020-07-12 MED ORDER — HYDROMORPHONE HCL 1 MG/ML IJ SOLN
INTRAMUSCULAR | Status: AC
  Filled 2020-07-12: qty 1

## 2020-07-12 MED ORDER — FENTANYL CITRATE (PF) 100 MCG/2ML IJ SOLN
INTRAMUSCULAR | Status: AC
  Filled 2020-07-12: qty 2

## 2020-07-12 MED ORDER — DIPHENHYDRAMINE HCL 50 MG/ML IJ SOLN: 50.0000 mg | Freq: Once | INTRAMUSCULAR | Status: DC | PRN

## 2020-07-12 MED ORDER — HYDROMORPHONE HCL 1 MG/ML IJ SOLN: 1.0000 mg | Freq: Once | INTRAMUSCULAR | Status: DC | PRN

## 2020-07-12 MED ORDER — HEPARIN SODIUM (PORCINE) 1000 UNIT/ML IJ SOLN
INTRAMUSCULAR | Status: DC | PRN
  Administered 2020-07-12: 3000 [IU] via INTRAVENOUS

## 2020-07-12 MED ORDER — FAMOTIDINE 20 MG PO TABS: 40.0000 mg | ORAL_TABLET | Freq: Once | ORAL | Status: DC | PRN

## 2020-07-12 SURGICAL SUPPLY — 22 items
BLOCK BEAD 500-700 (Vascular Products) ×1 IMPLANT
CATH ANGIO 5F PIGTAIL 65CM (CATHETERS) ×1 IMPLANT
CATH MICROCATH PRGRT 2.8F 110 (CATHETERS) IMPLANT
CATH TEMPO 5F RIM 65CM (CATHETERS) ×1 IMPLANT
COIL 400 COMPLEX SOFT 4X15CM (Vascular Products) ×1 IMPLANT
COIL 400 COMPLEX STD 4X35CM (Vascular Products) ×1 IMPLANT
COIL 400 COMPLEX STD 5X12CM (Vascular Products) ×1 IMPLANT
COIL 400 COMPLEX STD 5X30CM (Vascular Products) ×2 IMPLANT
DEVICE OCCLUSION PODJ30 (Vascular Products) IMPLANT
DEVICE OCCLUSION PODJ60 (Vascular Products) IMPLANT
DEVICE STARCLOSE SE CLOSURE (Vascular Products) ×1 IMPLANT
DEVICE TORQUE (MISCELLANEOUS) ×1 IMPLANT
GLIDEWIRE STIFF .35X180X3 HYDR (WIRE) ×1 IMPLANT
HANDLE DETACHMENT COIL (MISCELLANEOUS) ×1 IMPLANT
MICROCATH PROGREAT 2.8F 110 CM (CATHETERS) ×2
OCCLUSION DEVICE PODJ30 (Vascular Products) ×2 IMPLANT
OCCLUSION DEVICE PODJ60 (Vascular Products) ×2 IMPLANT
PACK ANGIOGRAPHY (CUSTOM PROCEDURE TRAY) ×2 IMPLANT
SHEATH BRITE TIP 5FRX11 (SHEATH) ×1 IMPLANT
STOPCOCK 3 WAY MALE LL (IV SETS) ×2
STOPCOCK 3WAY MALE LL (IV SETS) IMPLANT
WIRE GUIDERIGHT .035X150 (WIRE) ×1 IMPLANT

## 2020-07-12 NOTE — Interval H&P Note (Signed)
History and Physical Interval Note:  07/12/2020 9:08 AM  Krista Fernandez  has presented today for surgery, with the diagnosis of Uterine Artery Embolization   Uterine fibroid   Pt to have Covid test on 2-10.  The various methods of treatment have been discussed with the patient and family. After consideration of risks, benefits and other options for treatment, the patient has consented to  Procedure(s): EMBOLIZATION (N/A) as a surgical intervention.  The patient's history has been reviewed, patient examined, no change in status, stable for surgery.  I have reviewed the patient's chart and labs.  Questions were answered to the patient's satisfaction.     Leotis Pain

## 2020-07-12 NOTE — OR Nursing (Signed)
Bladder scan reveals 121mL

## 2020-07-12 NOTE — Op Note (Signed)
[facility]  Percutaneous Study/Intervention Procedural Note   Date of Surgery: 07/12/2020  Surgeon(s): Leotis Pain  Assistants:none  Pre-operative Diagnosis: Symptomatic uterine fibroids with dysfunctional uterine bleeding  Post-operative diagnosis:  Same  Procedure(s) Performed:             1.  Ultrasound guidance for vascular access right femoral artery             2.  Catheter placement into left uterine artery and right uterine artery from right femoral approach             3.  Aortogram and selective pelvic arteriograms including selective imaging of both uterine arteries             4.  Micro-bead embolization with 5 cc of 500-700  polyvinyl alcohol beads to the left uterine artery as well as placement of 3 Ruby coils in the left uterine artery             5.  Micro-bead embolization with 5 cc of 500-700  polyvinyl alcohol beads to the right uterine artery as well as placement of 4 Ruby coils in the right uterine artery  6.  StarClose closure device right femoral artery  EBL: 5 cc  Contrast: 60 cc  Fluoro Time: 8.1 min  Moderate Conscious Sedation time: approximately 55 minutes using 5 mg of Versed and 100 mcg of Fentanyl              Indications:  Patient is a 50 y.o. female with significant pain, anemia, and severe dysfunctional uterine bleeding from uterine fibroids. She is interested in having uterine artery embolization to treat her fibroids. Risks and benefits are discussed and informed consent is obtained  Procedure:  The patient was identified and appropriate procedural time out was performed.  The patient was then placed supine on the table and prepped and draped in the usual sterile fashion. Moderate conscious sedation was administered throughout the procedure with a face to face encounter with the patient throughout and with my supervision of the RN administering medicines and monitoring the patients vital signs and mental status throughout  from the start of the procedure until the patient was taken to the recovery room.   Ultrasound was used to evaluate the right common femoral artery.  It was patent .  A digital ultrasound image was acquired.  A Seldinger needle was used to access the right common femoral artery under direct ultrasound guidance and a permanent image was performed.  A 0.035 J wire was advanced without resistance and a 5Fr sheath was placed. 3000 units of heparin were given. Pigtail catheter was placed into the aorta and an AP aortogram was performed. This demonstrated normal renal arteries and normal aorta and iliac segments without significant stenosis. On the initial imaging, significant flow through larger than average uterine arteries was identified and on the initial aortogram. I then crossed the aortic bifurcation and advanced to the left iliac bifurcation. Using a rim catheter, I was able to cannulate the hypogastric artery on the left. Selective imaging was performed of the hypogastric artery which demonstrated the uterine artery to be essentially a third branch after an initial lateral branch and then this was the more lateral of the next 2 branches. Using a prograde micro catheter, I was able to cannulate the left uterine artery and advance beyond its initial branches well into the left uterine artery.  Selective imaging was performed. I then performed left uterine artery embolization using  approximately 5 cc of 500-700  polyvinyl alcohol beads into the left uterine artery for selective embolization until there was educed forward flow into the uterine artery going to the uterus.  I then proceeded with placement of Ruby coils to complete the embolization of the left uterine artery.  I used a 4 mm x 35 cm length standard, 4 mm x 15 cm length soft, and then a packing coil to completely embolized for the left uterine artery.  Imaging showed minimal forward flow following this.  The microcatheter was then removed. I then turned  my attention to the right uterine artery. I was able to cannulate the right hypogastric artery with the rim catheter from the right femoral approach and using a guidewire advanced this into the main right hypogastric artery. Selective imaging was performed in the right hypogastric artery which demonstrated what appeared to essentially be a trifurcation of the primary right hypogastric artery with the uterine artery being the most lateral branch.  I was able to advance the pro-great microcatheter into the uterine artery and down beyond its primary branches near where it entered the uterus itself. Selective imaging was performed. Approximately 5 cc of the 500-700  polyvinyl alcohol beads were then instilled into the right uterine artery for selective embolization until there was decreased forward flow into the uterus from the right uterine artery.  Given her many fibroids and her severe symptoms, I elected to perform coil embolization as well.  The right uterine artery was slightly larger.  A total of 4 Ruby coils were placed on the right.  These were 5 mm diameter by 30 cm length standard for the first 2, a 60 cm packing coil, and then a 5 mm x 12 centimeters standard Ruby coil.  Following this, there was minimal forward flow through the right uterine artery.  The diagnostic catheter was then removed. I elected to terminate the procedure. The sheath was removed and StarClose closure device was deployed in the right femoral artery with excellent hemostatic result. The patient was taken to the recovery room in stable condition having tolerated the procedure well.                 Disposition: Patient was taken to the recovery room in stable condition having tolerated the procedure well.  Complications: None

## 2020-07-12 NOTE — Discharge Instructions (Signed)
Uterine Artery Embolization for Fibroids, Care After  This sheet gives you information about how to care for yourself after your procedure. Your health care provider may also give you more specific instructions. If you have problems or questions, contact your health care provider.  What can I expect after the procedure?  After your procedure, it is common to have:  · Pelvic cramping. You will be given pain medicine.  · Nausea and vomiting. You may be given medicine to help relieve nausea.  Follow these instructions at home:  Incision care  · Follow instructions from your health care provider about how to take care of your incision. Make sure you:  ? Wash your hands with soap and water before you change your bandage (dressing). If soap and water are not available, use hand sanitizer.  ? Change your dressing as told by your health care provider.  · Check your incision area every day for signs of infection. Check for:  ? More redness, swelling, or pain.  ? More fluid or blood.  ? Warmth.  ? Pus or a bad smell.  Medicines    · Take over-the-counter and prescription medicines only as told by your health care provider.  · Do not take aspirin. It can cause bleeding.  · Do not drive for 24 hours if you were given a medicine to help you relax (sedative).  · Do not drive or use heavy machinery while taking prescription pain medicine.  General instructions  · Ask your health care provider when you can resume sexual activity.  · To prevent or treat constipation while you are taking prescription pain medicine, your health care provider may recommend that you:  ? Drink enough fluid to keep your urine clear or pale yellow.  ? Take over-the-counter or prescription medicines.  ? Eat foods that are high in fiber, such as fresh fruits and vegetables, whole grains, and beans.  ? Limit foods that are high in fat and processed sugars, such as fried and sweet foods.  Contact a health care provider if:  · You have a fever.  · You have more  redness, swelling, or pain around your incision site.  · You have more fluid or blood coming from your incision site.  · Your incision feels warm to the touch.  · You have pus or a bad smell coming from your incision.  · You have a rash.  · You have uncontrolled nausea or you cannot eat or drink anything without vomiting.  Get help right away if:  · You have trouble breathing.  · You have chest pain.  · You have severe abdominal pain.  · You have leg pain.  · You become dizzy and faint.  Summary  · After your procedure, it is common to have pelvic cramping. You will be given pain medicine.  · Follow instructions from your health care provider about how to take care of your incision.  · Check your incision area every day for signs of infection.  · Take over-the-counter and prescription medicines only as told by your health care provider.  This information is not intended to replace advice given to you by your health care provider. Make sure you discuss any questions you have with your health care provider.  Document Revised: 04/27/2017 Document Reviewed: 08/17/2016  Elsevier Patient Education © 2021 [employer]

## 2020-08-02 ENCOUNTER — Encounter (INDEPENDENT_AMBULATORY_CARE_PROVIDER_SITE_OTHER): Payer: Self-pay | Admitting: Nurse Practitioner

## 2020-08-02 ENCOUNTER — Other Ambulatory Visit: Payer: Self-pay

## 2020-08-02 ENCOUNTER — Ambulatory Visit (INDEPENDENT_AMBULATORY_CARE_PROVIDER_SITE_OTHER): Payer: 59 | Admitting: Nurse Practitioner

## 2020-08-02 VITALS — BP 120/81 | HR 92 | Resp 16 | Wt 136.6 lb

## 2020-08-02 DIAGNOSIS — D259 Leiomyoma of uterus, unspecified: Secondary | ICD-10-CM | POA: Diagnosis not present

## 2020-08-08 ENCOUNTER — Encounter (INDEPENDENT_AMBULATORY_CARE_PROVIDER_SITE_OTHER): Payer: Self-pay | Admitting: Nurse Practitioner

## 2020-08-08 NOTE — Progress Notes (Signed)
Subjective:    Patient ID: Krista Fernandez, female    DOB: 10-08-1970, 50 y.o.   MRN: [id number] Chief Complaint  Patient presents with  . Follow-up    [facility] 3 wk post embolization    Krista Fernandez is a 50 y.o. female.  The patient presents following uterine artery ablation following painful and dysfunctional uterine bleeding from several uterine fibroids.  This was ongoing for 4 to 5 years.  Following the embolization the patient did well.  She has not needed any pain medication.  She has had 1 menstrual cycle and notes that it was consistent with previous cycles.  She denies any abdominal pain.  She denies any bleeding from her puncture sites or fever or chills.      Review of Systems  Gastrointestinal: Negative for abdominal pain.  All other systems reviewed and are negative.      Objective:   Physical Exam Vitals reviewed.  HENT:     Head: Normocephalic.  Pulmonary:     Effort: Pulmonary effort is normal.  Skin:    General: Skin is warm and dry.  Neurological:     Mental Status: She is alert and oriented to person, place, and time.  Psychiatric:        Mood and Affect: Mood normal.        Behavior: Behavior normal.        Thought Content: Thought content normal.        Judgment: Judgment normal.     BP 120/81 (BP Location: Right Arm)   Pulse 92   Resp 16   Wt 136 lb 9.6 oz (62 kg)   BMI 22.05 kg/m   Past Medical History:  Diagnosis Date  . Anemia   . Dysmenorrhea   . Uterine fibroid     Social History   Socioeconomic History  . Marital status: Married    Spouse name: Not on file  . Number of children: Not on file  . Years of education: Not on file  . Highest education level: Not on file  Occupational History  . Not on file  Tobacco Use  . Smoking status: Never Smoker  . Smokeless tobacco: Never Used  Substance and Sexual Activity  . Alcohol use: No    Alcohol/week: 0.0 standard drinks  . Drug use: No  . Sexual activity: Yes    Partners:  Male    Birth control/protection: Condom  Other Topics Concern  . Not on file  Social History Narrative   Lives at home with Husband and children. Stay at home Mom   Social Determinants of Health   Financial Resource Strain: Not on file  Food Insecurity: Not on file  Transportation Needs: Not on file  Physical Activity: Not on file  Stress: Not on file  Social Connections: Not on file  Intimate Partner Violence: Not on file    Past Surgical History:  Procedure Laterality Date  . DILATION AND CURETTAGE OF UTERUS    . EMBOLIZATION N/A 07/12/2020   Procedure: EMBOLIZATION;  Surgeon: Algernon Huxley, MD;  Location: [facility];  Service: Cardiovascular;  Laterality: N/A;    Family History  Problem Relation Age of Onset  . Liver cancer Mother   . CVA Father     No Known Allergies  CBC Latest Ref Rng & Units 12/21/2019 08/19/2015 08/19/2015  WBC 4.0 - 10.5 K/uL 9.1 - 11.5(H)  Hemoglobin 12.0 - 15.0 g/dL 8.8(L) 7.3(L) 5.6(L)  Hematocrit 36.0 - 46.0 %  29.7(L) 23.5(L) 18.7(L)  Platelets 150 - 400 K/uL 240 - 70(L)      CMP     Component Value Date/Time   NA 136 12/21/2019 1018   K 2.8 (L) 12/21/2019 1018   CL 100 12/21/2019 1018   CO2 28 12/21/2019 1018   GLUCOSE 128 (H) 12/21/2019 1018   BUN 17 07/12/2020 0930   CREATININE 0.54 07/12/2020 0930   CALCIUM 9.0 12/21/2019 1018   GFRNONAA >60 07/12/2020 0930   GFRAA >60 12/21/2019 1018     No results found.     Assessment & Plan:   1. Uterine leiomyoma, unspecified location The patient is doing well post embolization.  The first subsequent menstrual cycle was somewhat normal for her but subsequent cycles should become lighter.  Patient will follow up with her office on an as-needed basis.  Patient will continue to follow with her OB/GYN for concerns related to fibroids.   Current Outpatient Medications on File Prior to Visit  Medication Sig Dispense Refill  . ferrous sulfate 325 (65 FE) MG tablet Take 1 tablet  (325 mg total) by mouth 2 (two) times daily with a meal. 60 tablet 3  . HYDROcodone-acetaminophen (NORCO) 5-325 MG tablet Take 1 tablet by mouth every 6 (six) hours as needed for moderate pain. (Patient not taking: Reported on 08/02/2020) 30 tablet 0  . norethindrone (AYGESTIN) 5 MG tablet Take 1 tablet (5 mg total) by mouth as directed. Take 1 tablet 3 times daily for 4 days.  Then 2 tablets daily for 7 days.  Then 1 tablet daily 100 tablet 1   No current facility-administered medications on file prior to visit.    There are no Patient Instructions on file for this visit. No follow-ups on file.   Kris Hartmann, NP

## 2021-11-17 DIAGNOSIS — R300 Dysuria: Secondary | ICD-10-CM | POA: Diagnosis not present

## 2022-03-27 ENCOUNTER — Encounter (INDEPENDENT_AMBULATORY_CARE_PROVIDER_SITE_OTHER): Payer: Self-pay
